# Patient Record
Sex: Female | Born: 1970 | Race: White | Hispanic: Yes | Marital: Married | Smoking: Never smoker
Health system: Southern US, Community
[De-identification: ages and names within clinical notes are randomized; demographics above are authoritative.]

## PROBLEM LIST (undated history)

## (undated) DIAGNOSIS — I10 Essential (primary) hypertension: Secondary | ICD-10-CM

## (undated) HISTORY — PX: OTHER SURGICAL HISTORY: SHX169

## (undated) HISTORY — PX: HIP FRACTURE SURGERY: SHX118

---

## 1999-08-20 ENCOUNTER — Encounter: Admission: RE | Admit: 1999-08-20 | Discharge: 1999-08-20 | Payer: Self-pay | Admitting: Internal Medicine

## 1999-08-21 ENCOUNTER — Encounter: Admission: RE | Admit: 1999-08-21 | Discharge: 1999-11-19 | Payer: Self-pay | Admitting: Infectious Diseases

## 1999-10-22 ENCOUNTER — Encounter: Admission: RE | Admit: 1999-10-22 | Discharge: 1999-10-22 | Payer: Self-pay | Admitting: Internal Medicine

## 1999-10-29 ENCOUNTER — Encounter: Admission: RE | Admit: 1999-10-29 | Discharge: 1999-10-29 | Payer: Self-pay | Admitting: Internal Medicine

## 1999-11-20 ENCOUNTER — Encounter: Admission: RE | Admit: 1999-11-20 | Discharge: 1999-12-30 | Payer: Self-pay | Admitting: Infectious Diseases

## 2000-07-20 ENCOUNTER — Encounter: Admission: RE | Admit: 2000-07-20 | Discharge: 2000-07-20 | Payer: Self-pay | Admitting: Internal Medicine

## 2000-10-15 ENCOUNTER — Encounter: Admission: RE | Admit: 2000-10-15 | Discharge: 2000-10-15 | Payer: Self-pay | Admitting: Internal Medicine

## 2000-11-09 ENCOUNTER — Inpatient Hospital Stay (HOSPITAL_COMMUNITY): Admission: AD | Admit: 2000-11-09 | Discharge: 2000-11-09 | Payer: Self-pay | Admitting: Obstetrics

## 2000-11-10 ENCOUNTER — Encounter: Payer: Self-pay | Admitting: Obstetrics

## 2000-11-11 ENCOUNTER — Inpatient Hospital Stay (HOSPITAL_COMMUNITY): Admission: AD | Admit: 2000-11-11 | Discharge: 2000-11-11 | Payer: Self-pay | Admitting: Obstetrics

## 2000-11-13 ENCOUNTER — Ambulatory Visit (HOSPITAL_COMMUNITY): Admission: RE | Admit: 2000-11-13 | Discharge: 2000-11-13 | Payer: Self-pay | Admitting: Obstetrics

## 2000-11-13 ENCOUNTER — Encounter (INDEPENDENT_AMBULATORY_CARE_PROVIDER_SITE_OTHER): Payer: Self-pay | Admitting: Specialist

## 2000-12-15 ENCOUNTER — Encounter: Admission: RE | Admit: 2000-12-15 | Discharge: 2000-12-15 | Payer: Self-pay | Admitting: Obstetrics & Gynecology

## 2002-09-16 ENCOUNTER — Encounter: Payer: Self-pay | Admitting: *Deleted

## 2002-09-16 ENCOUNTER — Inpatient Hospital Stay (HOSPITAL_COMMUNITY): Admission: AD | Admit: 2002-09-16 | Discharge: 2002-09-16 | Payer: Self-pay | Admitting: Family Medicine

## 2002-09-19 ENCOUNTER — Inpatient Hospital Stay (HOSPITAL_COMMUNITY): Admission: AD | Admit: 2002-09-19 | Discharge: 2002-09-19 | Payer: Self-pay | Admitting: Obstetrics

## 2002-09-23 ENCOUNTER — Inpatient Hospital Stay (HOSPITAL_COMMUNITY): Admission: RE | Admit: 2002-09-23 | Discharge: 2002-09-23 | Payer: Self-pay | Admitting: Obstetrics

## 2002-09-23 ENCOUNTER — Encounter: Payer: Self-pay | Admitting: Obstetrics

## 2002-09-26 ENCOUNTER — Inpatient Hospital Stay (HOSPITAL_COMMUNITY): Admission: AD | Admit: 2002-09-26 | Discharge: 2002-09-26 | Payer: Self-pay | Admitting: Obstetrics

## 2002-09-26 ENCOUNTER — Encounter (INDEPENDENT_AMBULATORY_CARE_PROVIDER_SITE_OTHER): Payer: Self-pay

## 2003-10-18 ENCOUNTER — Inpatient Hospital Stay (HOSPITAL_COMMUNITY): Admission: AD | Admit: 2003-10-18 | Discharge: 2003-10-18 | Payer: Self-pay | Admitting: Obstetrics

## 2003-10-27 ENCOUNTER — Inpatient Hospital Stay (HOSPITAL_COMMUNITY): Admission: RE | Admit: 2003-10-27 | Discharge: 2003-10-27 | Payer: Self-pay | Admitting: Family Medicine

## 2003-11-02 ENCOUNTER — Ambulatory Visit: Payer: Self-pay | Admitting: Family Medicine

## 2003-11-16 ENCOUNTER — Ambulatory Visit: Payer: Self-pay | Admitting: Obstetrics and Gynecology

## 2006-08-17 ENCOUNTER — Emergency Department (HOSPITAL_COMMUNITY): Admission: EM | Admit: 2006-08-17 | Discharge: 2006-08-17 | Payer: Self-pay | Admitting: Emergency Medicine

## 2007-11-10 ENCOUNTER — Inpatient Hospital Stay (HOSPITAL_COMMUNITY): Admission: AD | Admit: 2007-11-10 | Discharge: 2007-11-10 | Payer: Self-pay | Admitting: Obstetrics & Gynecology

## 2007-11-13 ENCOUNTER — Inpatient Hospital Stay (HOSPITAL_COMMUNITY): Admission: AD | Admit: 2007-11-13 | Discharge: 2007-11-13 | Payer: Self-pay | Admitting: Obstetrics & Gynecology

## 2007-11-24 ENCOUNTER — Ambulatory Visit: Payer: Self-pay | Admitting: Obstetrics and Gynecology

## 2007-12-15 ENCOUNTER — Encounter: Payer: Self-pay | Admitting: Obstetrics and Gynecology

## 2007-12-15 ENCOUNTER — Ambulatory Visit: Payer: Self-pay | Admitting: Obstetrics & Gynecology

## 2010-02-09 ENCOUNTER — Encounter: Payer: Self-pay | Admitting: *Deleted

## 2010-02-09 ENCOUNTER — Encounter: Payer: Self-pay | Admitting: Obstetrics and Gynecology

## 2010-02-10 ENCOUNTER — Encounter: Payer: Self-pay | Admitting: Obstetrics & Gynecology

## 2010-06-04 NOTE — Group Therapy Note (Signed)
NAME:  Rachel Le NO.:  [id number]   MEDICAL RECORD NO.:  [id number]          PATIENT TYPE:  WOC   LOCATION:  [facility]                   FACILITY:  [facility]   PHYSICIAN:  Argentina Donovan, MD        DATE OF BIRTH:  03-Jun-1970   DATE OF SERVICE:                                  CLINIC NOTE   The patient is a 40 year old Hispanic female gravida 5, para 0-0-5-0 who  was seen in the MAU on October 24 and found to be about [redacted] weeks  pregnant on ultrasound that showed a 6-week fetal demise.  She was given  Cytotec and within a couple days passed everything.  She has been fine  since that point.  We are drawing a beta HCG to make sure everything has  been passed today.  The patient said she was told that she should not  get pregnant because of her uterus.  She had an ultrasound that was done  back in 2005 that was consistent with a bicornate uterus of very small  size.  She is interested to some degree in having a tubal ligation.  I  talked to her husband and he said he is willing to go for a vasectomy.  I told her they could make their decision and we would try and comply  with her wishes.   IMPRESSION:  Complete abortion.  The patient should be notified of the  results of the quantitative data.           ______________________________  Argentina Donovan, MD     PR/MEDQ  D:  11/24/2007  T:  11/24/2007  Job:  [id number]

## 2010-06-07 NOTE — Group Therapy Note (Signed)
NAME:  Rachel Le NO.:  [id number]   MEDICAL RECORD NO.:  [id number]          PATIENT TYPE:  WOC   LOCATION:  [facility]                   FACILITY:  [facility]   PHYSICIAN:  Tinnie Gens, MD        DATE OF BIRTH:  [birth year]   DATE OF SERVICE:  11/02/2003                                    CLINIC NOTE   CHIEF COMPLAINT:  Follow-up.   HISTORY OF PRESENT ILLNESS:  The patient is a 40 year old gravida 4 para 0-0-  4-0 with history of four SABs, the latest of which was approximately 1 week  ago for which she received Cytotec.  She does have some bleeding and  continues to bleed today.  The patient was noted at the time of her  miscarriage to have a bicornuate versus septate uterus on ultrasound which  has never been completely worked up.  She is here for follow-up on that and  possible workup.   PAST MEDICAL HISTORY:  Negative.  The patient does have a history of a fall  with injured legs at age 89.  She uses crutches full time.   PAST SURGICAL HISTORY:  She did have surgery on her legs.   ALLERGIES:  None known.   MEDICATIONS:  None.   OBSTETRICAL HISTORY:  Four SABs.   GYNECOLOGICAL HISTORY:  Negative.   FAMILY HISTORY:  Significant for diabetes, coronary artery disease, and  hypertension.   SOCIAL HISTORY:  She denies tobacco, alcohol, or drug use.   REVIEW OF SYSTEMS:  A 14-point review of systems reviewed and is negative  except for as stated in the HPI.   PHYSICAL EXAMINATION:  VITAL SIGNS:  Her pulse is 90, blood pressure 115/68,  weight is 115.  GENERAL:  She is a very small Hispanic female in no acute distress.  ABDOMEN:  Soft, nontender, nondistended.  GENITOURINARY:  She has normal external female genitalia.  The vagina has a  lot of blood pooled in the vault.  The uterus is approximately 6 weeks size  and nontender.   IMPRESSION:  1.  Status post Cytotec, continued bleeding.  2.  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   PLAN:  1.  The patient will follow up in 2 weeks for the post AB visit.  She will      have Pap smear and discussion of birth control at that time.  2.  When the patient and her husband are ready and available, will send them      for MRI for confirmation of diagnosis, followed by appropriate surgical      management and referral as needed.      TP/MEDQ  D:  11/02/2003  T:  11/02/2003  Job:  [id number]

## 2010-06-07 NOTE — Op Note (Signed)
[facility]  Patient:    Rachel Le Visit Number: [id number] MRN: [id number]          Service Type: DSU Location: [facility] Attending Physician:  Tammi Sou Dictated by:   Bing Neighbors Clearance Coots, M.D. Proc. Date: 11/13/00 Admit Date:  11/13/2000 Discharge Date: 11/13/2000                             Operative Report  PREOPERATIVE DIAGNOSES:       A 7 week intrauterine fetal demise by ultrasound.  POSTOPERATIVE DIAGNOSES:      A 7 week intrauterine fetal demise by ultrasound.  PROCEDURE:                    Suction dilatation and evacuation.  SURGEON:                      Charles A. Clearance Coots, M.D.  ANESTHESIA:                   MAC with paracervical block.  ESTIMATED BLOOD LOSS:         100 ml.  COMPLICATIONS:                None.  SPECIMEN:                     Products of conception.  OPERATION:                    Patient was brought to the operating room after satisfactory IV sedation.  Legs were brought up in stirrups and the vagina was prepped and draped in the usual sterile fashion.  The urinary bladder was emptied of approximately 50 cc of clear urine.  Bimanual examination revealed the uterus to be mid position, about 8 weeks size.  Sterile speculum was inserted in the vaginal vault and cervix was isolated.  Anterior lip of the cervix was grasped with a single tooth tenaculum.  Approximately 20 ml of 2% plain Xylocaine with 2 ml of sodium bicarbonate was injected in the cervical stroma in a routine fashion, approximately 8 ml in each lateral fornix at the 3 and 6 oclock position and approximately 4 ml in the anterior lip of the cervix prior to application of the single tooth tenaculum.  The uterus was sounded and the cervix was dilated to approximately 25 ml with the [facility] dilator.  A #8 suction catheter was easily introduced into the uterine cavity. All contents were evacuated.  Endometrial surface was then curetted with  a small sharp curette.  No further products of conception were obtained.  There was no active bleeding at the conclusion of the procedure.  The uterus contracted down quite well.  All instruments were retired.  Patient tolerated procedure well.  Was transported to the recovery room in satisfactory condition. Dictated by:   Bing Neighbors Clearance Coots, M.D. Attending Physician:  Tammi Sou DD:  11/13/00 TD:  11/15/00 Job: [id number]

## 2010-06-07 NOTE — Group Therapy Note (Signed)
NAME:  Rachel Le NO.:  [id number]   MEDICAL RECORD NO.:  [id number]          PATIENT TYPE:  WOC   [facility]ATION[facility]                   [facility]ACILITY[facility]  [facility]   PHYSICIAN:  Argentina Donovan, MD        DATE OF BIRTH:  05/20/1970   DATE OF SERVICE:  11/16/2003                                    CLINIC NOTE   REASON FOR VISIT:  The patient is a 40 year old gravida 4 para 0-0-4-0  Hispanic female with a history of four spontaneous abortions, all within the  early first trimester.  She came in today after having finally stopped  bleeding and a Pap smear was taken.  She has never been worked up, but on  ultrasound they thought there either was a uterine septum or possibly a  bicornuate uterus.  She is going to be followed up with an MRI, but since  they were all first trimester miscarriages we are going to do an  anticardiolipin antibody, antinuclear antibody, and thyroid evaluation as a  step-wise process evaluating why this young lady continues to miscarry.   DIAGNOSIS:  Habitual abortion.      PR/MEDQ  D:  11/16/2003  T:  11/16/2003  Job:  [id number]

## 2010-10-21 LAB — CBC
HCT: 40.3
HCT: 41.2
Hemoglobin: 13.4
MCV: 92.9
MCV: 94.1
Platelets: 233
RDW: 12.9
RDW: 12.9

## 2010-10-21 LAB — TYPE AND SCREEN: Antibody Screen: NEGATIVE

## 2010-10-21 LAB — ABO/RH: ABO/RH(D): O POS

## 2010-10-21 LAB — WET PREP, GENITAL: Yeast Wet Prep HPF POC: NONE SEEN

## 2010-10-21 LAB — HCG, QUANTITATIVE, PREGNANCY: hCG, Beta Chain, Quant, S: 1964 — ABNORMAL HIGH

## 2010-10-21 LAB — GC/CHLAMYDIA PROBE AMP, GENITAL: Chlamydia, DNA Probe: NEGATIVE

## 2015-12-17 ENCOUNTER — Other Ambulatory Visit: Payer: Self-pay | Admitting: Obstetrics and Gynecology

## 2015-12-17 DIAGNOSIS — Z1231 Encounter for screening mammogram for malignant neoplasm of breast: Secondary | ICD-10-CM

## 2016-01-17 ENCOUNTER — Ambulatory Visit (HOSPITAL_COMMUNITY)
Admission: RE | Admit: 2016-01-17 | Discharge: 2016-01-17 | Disposition: A | Discharge status: Home / Self Care | Payer: Self-pay | Source: Ambulatory Visit | Attending: Obstetrics and Gynecology | Admitting: Obstetrics and Gynecology

## 2016-01-17 ENCOUNTER — Encounter (HOSPITAL_COMMUNITY): Payer: Self-pay | Admitting: *Deleted

## 2016-01-17 ENCOUNTER — Ambulatory Visit
Admission: RE | Admit: 2016-01-17 | Discharge: 2016-01-17 | Disposition: A | Discharge status: Home / Self Care | Payer: No Typology Code available for payment source | Source: Ambulatory Visit | Attending: Obstetrics and Gynecology | Admitting: Obstetrics and Gynecology

## 2016-01-17 VITALS — BP 152/94 | Temp 97.4°F | Wt 149.6 lb

## 2016-01-17 DIAGNOSIS — Z01419 Encounter for gynecological examination (general) (routine) without abnormal findings: Secondary | ICD-10-CM

## 2016-01-17 DIAGNOSIS — Z1231 Encounter for screening mammogram for malignant neoplasm of breast: Secondary | ICD-10-CM

## 2016-01-17 HISTORY — DX: Essential (primary) hypertension: I10

## 2016-01-17 NOTE — Progress Notes (Signed)
Pap smear completed

## 2016-01-17 NOTE — Patient Instructions (Signed)
Explained breast self awareness to Health Netvonne Cervantes. Patient did not need a Pap smear today due to last Pap smear was in February 2014 per patient. Let her know [facility] will cover Pap smears and HPV typing every 5 years unless has a history of abnormal Pap smears. [facility]. Appointment scheduled for Thursday, January 17, 2016 at 1400. Let patient know will follow up with her within the next couple weeks with results of Pap smear by phone. Informed patient that the Breast Center will follow up with her within the next couple of weeks with results of mammogram by letter or phone. Wendelyn BreslowIvonne Cervantes verbalized understanding.  Keiko Myricks, Kathaleen Maserhristine Poll, RN 2:36 PM

## 2016-01-17 NOTE — Progress Notes (Signed)
No complaints today.   Pap Smear: Pap smear completed today. Last Pap smear was in February 2014 at the free cervical cancer screening [facility] and normal per patient. Per patient has no history of an abnormal Pap smear. No Pap smear results are in EPIC.  Physical exam: Breasts Breasts symmetrical. No skin abnormalities bilateral breasts. No nipple retraction bilateral breasts. No nipple discharge bilateral breasts. No lymphadenopathy. No lumps palpated bilateral breasts. No complaints of pain or tenderness on exam. [facility] mamm[facility]. Appointment scheduled for Thursday, January 17, 2016 at 1400.  Pelvic/Bimanual   Ext Genitalia No lesions, no swelling and no discharge observed on external genitalia.         Vagina Vagina pink and normal texture. No lesions or discharge observed in vagina.          Cervix Cervix is present. Cervix pink and of normal texture. No discharge observed.     Uterus Uterus is present and palpable. Uterus in normal position and normal size.        Adnexae Bilateral ovaries present and palpable. No tenderness on palpation.          Rectovaginal No rectal exam completed today since patient had no rectal complaints. No skin abnormalities observed on exam.    Smoking History: Patient has never smoked.  Patient Navigation: Patient education provided. Access to services provided [facility]. Spanish interpreter provided.  Used Spanish interpreter [employer] from [place].

## 2016-01-18 LAB — CYTOLOGY - PAP
Diagnosis: NEGATIVE
HPV: NOT DETECTED

## 2016-01-25 ENCOUNTER — Encounter (HOSPITAL_COMMUNITY): Payer: Self-pay | Admitting: *Deleted

## 2016-02-11 ENCOUNTER — Telehealth (HOSPITAL_COMMUNITY): Payer: Self-pay | Admitting: *Deleted

## 2016-02-11 NOTE — Telephone Encounter (Signed)
Telephoned patient at home number and left message to return call to [facility] 

## 2016-02-13 ENCOUNTER — Telehealth (HOSPITAL_COMMUNITY): Payer: Self-pay | Admitting: *Deleted

## 2016-02-13 NOTE — Telephone Encounter (Signed)
Telephoned patient at home number and discussed negative pap smear results. HPV was negative. Next pap smear due in five years. Used interpreter Julie Sowell.  

## 2017-03-10 ENCOUNTER — Other Ambulatory Visit: Payer: Self-pay | Admitting: Obstetrics and Gynecology

## 2017-03-10 DIAGNOSIS — Z1231 Encounter for screening mammogram for malignant neoplasm of breast: Secondary | ICD-10-CM

## 2017-04-02 ENCOUNTER — Ambulatory Visit
Admission: RE | Admit: 2017-04-02 | Discharge: 2017-04-02 | Disposition: A | Discharge status: Home / Self Care | Payer: No Typology Code available for payment source | Source: Ambulatory Visit | Attending: Obstetrics and Gynecology | Admitting: Obstetrics and Gynecology

## 2017-04-02 ENCOUNTER — Ambulatory Visit (HOSPITAL_COMMUNITY)
Admission: RE | Admit: 2017-04-02 | Discharge: 2017-04-02 | Disposition: A | Discharge status: Home / Self Care | Payer: Self-pay | Source: Ambulatory Visit | Attending: Obstetrics and Gynecology | Admitting: Obstetrics and Gynecology

## 2017-04-02 ENCOUNTER — Encounter (HOSPITAL_COMMUNITY): Payer: Self-pay

## 2017-04-02 VITALS — BP 141/74

## 2017-04-02 DIAGNOSIS — Z1239 Encounter for other screening for malignant neoplasm of breast: Secondary | ICD-10-CM

## 2017-04-02 DIAGNOSIS — Z1231 Encounter for screening mammogram for malignant neoplasm of breast: Secondary | ICD-10-CM

## 2017-04-02 NOTE — Progress Notes (Signed)
No complaints today.   Pap Smear: Pap smear not completed today. [facility] and normal with negative HPV. Per patient has no history of an abnormal Pap smear. Last Pap smear result is in Epic.  Physical exam: Breasts Breasts symmetrical. No skin abnormalities bilateral breasts. No nipple retraction bilateral breasts. No nipple discharge bilateral breasts. No lymphadenopathy. No lumps palpated bilateral breasts. No complaints of pain or tenderness on exam. [facility] for [facility] mamm[facility]. Appointment scheduled for Thursday, April 02, 2017 at 1010.        Pelvic/Bimanual No Pap smear completed today since last Pap smear and HPV typing was 01/17/2016. Pap smear not indicated per [facility] guidelines.   Smoking History: Patient has never smoked.  Patient Navigation: Patient education provided. Access to services provided for patient through [facility] program.   Colorectal Cancer Screening: Per patient has never had a colonoscopy completed. No complaints today. FIT Test given to patient to complete and return to [facility].  Breast and Cervical Cancer Risk Assessment: Patient has no family history of breast cancer, known genetic mutations, or radiation treatment to the chest before age 45. Patient has no history of cervical dysplasia, immunocompromised, or DES exposure in-utero.

## 2017-04-02 NOTE — Patient Instructions (Signed)
Explained breast self awareness with MA. Hedy CamaraIvonne Cervantes Dominguez. Patient did not need a Pap smear today due to last Pap smear and HPV typing was 01/17/2016. Let her know [facility] will cover Pap smears and HPV typing every 5 years unless has a history of abnormal Pap smears. [facility] screening mamm[facility]. Appointment scheduled for Thursday, April 02, 2017 at 1010. Let patient know the Breast Center will follow up with her within the next couple weeks with results of mammogram by letter or phone. MA. Hedy CamaraIvonne Cervantes Dominguez verbalized understanding.  Jermine Bibbee, Kathaleen Maserhristine Poll, RN 12:38 PM

## 2017-04-03 ENCOUNTER — Encounter (HOSPITAL_COMMUNITY): Payer: Self-pay | Admitting: *Deleted

## 2017-05-17 ENCOUNTER — Other Ambulatory Visit: Payer: Self-pay

## 2017-05-17 ENCOUNTER — Encounter (HOSPITAL_COMMUNITY): Payer: Self-pay | Admitting: *Deleted

## 2017-05-17 ENCOUNTER — Ambulatory Visit (HOSPITAL_COMMUNITY)
Admission: EM | Admit: 2017-05-17 | Discharge: 2017-05-17 | Disposition: A | Discharge status: Home / Self Care | Payer: No Typology Code available for payment source | Attending: Urgent Care | Admitting: Urgent Care

## 2017-05-17 DIAGNOSIS — I10 Essential (primary) hypertension: Secondary | ICD-10-CM

## 2017-05-17 DIAGNOSIS — Z8711 Personal history of peptic ulcer disease: Secondary | ICD-10-CM

## 2017-05-17 DIAGNOSIS — Z8719 Personal history of other diseases of the digestive system: Secondary | ICD-10-CM

## 2017-05-17 DIAGNOSIS — R112 Nausea with vomiting, unspecified: Secondary | ICD-10-CM

## 2017-05-17 DIAGNOSIS — R1013 Epigastric pain: Secondary | ICD-10-CM

## 2017-05-17 MED ORDER — RANITIDINE HCL 150 MG PO TABS: 150.0000 mg | ORAL_TABLET | Freq: Two times a day (BID) | ORAL | 0 refills | Status: DC

## 2017-05-17 MED ORDER — OMEPRAZOLE 20 MG PO CPDR: 20.0000 mg | DELAYED_RELEASE_CAPSULE | Freq: Every day | ORAL | 1 refills | Status: AC

## 2017-05-17 NOTE — ED Provider Notes (Signed)
  MRN: [id number] DOB: Jan 06, 1971  Subjective:   MA. Yong Wahlquist is a 47 y.o. female presenting for 1 day history of recurrent epigastric pain that radiates into her back.  She has had nausea and vomiting as well.  Denies fever, hematemesis, diarrhea, constipation, history of abdominal surgeries or pancreatitis, dysuria, urinary frequency, hematuria, rashes, dizziness, confusion.  Denies smoking cigarettes. Drinks 1 alcohol drink per month.  She does have a history of hypertension and is compliant with her blood pressure medications.  Admits to remote history of peptic ulcer disease, resolved with taking a course of Prilosec.  She did try Prilosec yesterday with minimal relief but has not been on this consistently.   No current facility-administered medications for this encounter.   Current Outpatient Medications:  .  lisinopril-hydrochlorothiazide (PRINZIDE,ZESTORETIC) 10-12.5 MG tablet, Take 1 tablet by mouth daily., Disp: , Rfl:     No Known Allergies   Past Medical History:  Diagnosis Date  . Hypertension      Past Surgical History:  Procedure Laterality Date  . bilateral knee surgery    . HIP FRACTURE SURGERY      Reports family history of HTN. Denies family history of cancer, diabetes, HL, heart disease, stroke, mental illness.    Objective:   Vitals: BP 126/64 (BP Location: Left Arm)   Pulse 78   Temp 98.2 F (36.8 C) (Oral)   LMP 04/28/2017 (Exact Date)   SpO2 100%   Physical Exam  Constitutional: She is oriented to person, place, and time. She appears well-developed and well-nourished.  HENT:  Mouth/Throat: Oropharynx is clear and moist.  Eyes: No scleral icterus.  Cardiovascular: Normal rate, regular rhythm and intact distal pulses. Exam reveals no gallop and no friction rub.  No murmur heard. Pulmonary/Chest: No respiratory distress. She has no wheezes. She has no rales.  Abdominal: Soft. Bowel sounds are normal. She exhibits no distension and  no mass. There is tenderness (exquisite tenderness throughout abdominal exam even with light pressure of stethoscope). There is no rebound and no guarding.  Musculoskeletal: She exhibits no edema.  Neurological: She is alert and oriented to person, place, and time.  Skin: Skin is warm and dry. No rash noted. No erythema. No pallor.  Psychiatric: She has a normal mood and affect.   Assessment and Plan :   Abdominal pain, epigastric  Non-intractable vomiting with nausea, unspecified vomiting type  History of gastric ulcer  Essential hypertension  Counseled patient with the exquisite tenderness she has on exam including light pressure of the stethoscope she may be better off going to the ER for further imaging.  Patient is not agreeable to this but would like to try to get a prescription for Prilosec again.  I counseled that she could try this as well as Zantac but should have a low threshold to go to the ER.  Patient is in agreement with treatment plan.   Wallis Bamberg, PA-C 05/17/17 1257

## 2017-05-17 NOTE — ED Triage Notes (Addendum)
Per pt her pain started yesterday. Per pt she is having upper abd pain. Per pt it radiates to the center of her back.

## 2018-02-03 ENCOUNTER — Other Ambulatory Visit (HOSPITAL_COMMUNITY): Payer: Self-pay | Admitting: *Deleted

## 2018-02-03 DIAGNOSIS — Z1231 Encounter for screening mammogram for malignant neoplasm of breast: Secondary | ICD-10-CM

## 2018-04-08 ENCOUNTER — Ambulatory Visit (HOSPITAL_COMMUNITY)
Admission: RE | Admit: 2018-04-08 | Discharge: 2018-04-08 | Disposition: A | Discharge status: Home / Self Care | Payer: Self-pay | Source: Ambulatory Visit | Attending: Obstetrics and Gynecology | Admitting: Obstetrics and Gynecology

## 2018-04-08 ENCOUNTER — Encounter (HOSPITAL_COMMUNITY): Payer: Self-pay

## 2018-04-08 ENCOUNTER — Other Ambulatory Visit: Payer: Self-pay

## 2018-04-08 ENCOUNTER — Ambulatory Visit
Admission: RE | Admit: 2018-04-08 | Discharge: 2018-04-08 | Disposition: A | Discharge status: Home / Self Care | Payer: No Typology Code available for payment source | Source: Ambulatory Visit | Attending: Obstetrics and Gynecology | Admitting: Obstetrics and Gynecology

## 2018-04-08 VITALS — BP 134/86 | Temp 98.4°F | Wt 122.0 lb

## 2018-04-08 DIAGNOSIS — Z1239 Encounter for other screening for malignant neoplasm of breast: Secondary | ICD-10-CM

## 2018-04-08 DIAGNOSIS — Z1231 Encounter for screening mammogram for malignant neoplasm of breast: Secondary | ICD-10-CM

## 2018-04-08 NOTE — Patient Instructions (Signed)
Explained breast self awareness with MA. Hedy Camara. Patient did not need a Pap smear today due to last Pap smear and HPV typing was 01/17/2016. Let her know [facility] will cover Pap smears and HPV typing every 5 years unless has a history of abnormal Pap smears. Referred patient to the [facility] for a screening mammogram. Appointment scheduled for Thursday, April 08, 2018 at 1110.  Patient aware of appointment and will be there. Let patient know the Breast Center will follow up with her within the next couple weeks with results of mammogram by letter or phone. MA. Hedy Camara verbalized understanding.  [place], Kathaleen Maser, RN 12:33 PM

## 2018-04-08 NOTE — Progress Notes (Signed)
No complaints today.   Pap Smear: Pap smear not completed today. Last Pap smear was 01/17/2016 at [facility] and normal with negative HPV. Per patient has no history of an abnormal Pap smear. Last Pap smear result is in Epic.  Physical exam: Breasts Breasts symmetrical. No skin abnormalities bilateral breasts. No nipple retraction bilateral breasts. No nipple discharge bilateral breasts. No lymphadenopathy. No lumps palpated bilateral breasts. No complaints of pain or tenderness on exam. Referred patient to the [facility] for a screening mammogram. Appointment scheduled for Thursday, April 08, 2018 at 1110.        Pelvic/Bimanual No Pap smear completed today since last Pap smear and HPV typing was 01/17/2016. Pap smear not indicated per [facility] guidelines.   Smoking History: Patient has never smoked.  Patient Navigation: Patient education provided. Access to services provided for patient through [facility] program. Spanish interpreter provided.   Breast and Cervical Cancer Risk Assessment: Patient has no family history of breast cancer, known genetic mutations, or radiation treatment to the chest before age 79. Patient has no history of cervical dysplasia, immunocompromised, or DES exposure in-utero.  Risk Assessment    Risk Scores      04/08/2018   Last edited by: Lynnell Dike, LPN   5-year risk: 0.6 %   Lifetime risk: 6.9 %         Used Spanish interpreter Natale Lay from [place].

## 2018-04-09 ENCOUNTER — Other Ambulatory Visit: Payer: Self-pay | Admitting: Obstetrics and Gynecology

## 2018-04-09 DIAGNOSIS — R928 Other abnormal and inconclusive findings on diagnostic imaging of breast: Secondary | ICD-10-CM

## 2018-04-21 ENCOUNTER — Ambulatory Visit
Admission: RE | Admit: 2018-04-21 | Discharge: 2018-04-21 | Disposition: A | Discharge status: Home / Self Care | Payer: No Typology Code available for payment source | Source: Ambulatory Visit | Attending: Obstetrics and Gynecology | Admitting: Obstetrics and Gynecology

## 2018-04-21 ENCOUNTER — Other Ambulatory Visit: Payer: Self-pay

## 2018-04-21 DIAGNOSIS — R928 Other abnormal and inconclusive findings on diagnostic imaging of breast: Secondary | ICD-10-CM

## 2018-05-06 ENCOUNTER — Encounter (HOSPITAL_COMMUNITY): Payer: Self-pay | Admitting: *Deleted

## 2019-04-13 ENCOUNTER — Other Ambulatory Visit: Payer: Self-pay

## 2019-04-13 DIAGNOSIS — Z1231 Encounter for screening mammogram for malignant neoplasm of breast: Secondary | ICD-10-CM

## 2019-05-05 ENCOUNTER — Ambulatory Visit
Admission: RE | Admit: 2019-05-05 | Discharge: 2019-05-05 | Disposition: A | Discharge status: Home / Self Care | Payer: No Typology Code available for payment source | Source: Ambulatory Visit | Attending: Obstetrics and Gynecology | Admitting: Obstetrics and Gynecology

## 2019-05-05 ENCOUNTER — Ambulatory Visit: Payer: Self-pay

## 2019-05-05 ENCOUNTER — Other Ambulatory Visit: Payer: Self-pay

## 2019-05-05 VITALS — BP 138/86 | Temp 98.6°F | Wt 127.0 lb

## 2019-05-05 DIAGNOSIS — Z1239 Encounter for other screening for malignant neoplasm of breast: Secondary | ICD-10-CM

## 2019-05-05 DIAGNOSIS — Z1231 Encounter for screening mammogram for malignant neoplasm of breast: Secondary | ICD-10-CM

## 2019-05-05 NOTE — Progress Notes (Signed)
[name]. Rachel Le is a 49 y.o. female who presents to [facility] clinic today for clinical breast exam.  Breast History:  Patient with no current breast complaints.  She states she does not perform breast exams, but does have breast awareness.     Gynecological History: Pap smear not completed today. Last Pap smear was Dec 2017 at  [facility] and was normal. Per patient has no history of an abnormal Pap smear. Last Pap smear result is available in Epic.   Physical exam: Breasts Breasts symmetrical. No skin abnormalities bilateral breasts. No nipple retraction bilateral breasts. No nipple discharge bilateral breasts. No lymphadenopathy. Diffuse lumps palpated bilaterally particular to lower inner and outer segments from ~ 5'o clock to 8'o clock.       Pelvic/Bimanual Pap is not indicated today    Smoking History: Patient has never smoked.    Patient Navigation: Patient education provided. Access to services provided for patient through [facility] program.   Colorectal Cancer Screening: Per patient has never had colonoscopy completed No complaints today.    Breast and Cervical Cancer Risk Assessment: Patient does not have family history of breast cancer, known genetic mutations, or radiation treatment to the chest before age 49. Patient does not have history of cervical dysplasia, immunocompromised, or DES exposure in-utero.  Risk Assessment    Risk Scores      05/05/2019 04/08/2018   Last edited by: Narda Rutherford, LPN Stoney Bang H, LPN   5-year risk: 0.6 % 0.6 %   Lifetime risk: 6.8 % 6.9 %          A: 49 year old Clinical Breast Exam  P: Referred patient to the [facility] of [facility] for a screening mammogram. Appointment scheduled April 15 at 1430pm.  Rachel Le, CNM 05/05/2019 1:47 PM

## 2019-08-25 ENCOUNTER — Encounter (HOSPITAL_COMMUNITY): Payer: Self-pay | Admitting: *Deleted

## 2019-08-25 ENCOUNTER — Ambulatory Visit (HOSPITAL_COMMUNITY)
Admission: EM | Admit: 2019-08-25 | Discharge: 2019-08-25 | Disposition: A | Discharge status: Home / Self Care | Payer: No Typology Code available for payment source

## 2019-08-25 ENCOUNTER — Observation Stay (HOSPITAL_COMMUNITY)
Admission: EM | Admit: 2019-08-25 | Discharge: 2019-08-27 | Disposition: A | Discharge status: Home / Self Care | Payer: No Typology Code available for payment source | Attending: Emergency Medicine | Admitting: Emergency Medicine

## 2019-08-25 ENCOUNTER — Other Ambulatory Visit: Payer: Self-pay

## 2019-08-25 ENCOUNTER — Emergency Department (HOSPITAL_COMMUNITY): Payer: No Typology Code available for payment source

## 2019-08-25 DIAGNOSIS — I10 Essential (primary) hypertension: Secondary | ICD-10-CM | POA: Diagnosis present

## 2019-08-25 DIAGNOSIS — Z20822 Contact with and (suspected) exposure to covid-19: Secondary | ICD-10-CM | POA: Insufficient documentation

## 2019-08-25 DIAGNOSIS — R1011 Right upper quadrant pain: Secondary | ICD-10-CM | POA: Diagnosis present

## 2019-08-25 DIAGNOSIS — Z79899 Other long term (current) drug therapy: Secondary | ICD-10-CM | POA: Insufficient documentation

## 2019-08-25 DIAGNOSIS — R7401 Elevation of levels of liver transaminase levels: Secondary | ICD-10-CM | POA: Diagnosis present

## 2019-08-25 DIAGNOSIS — B169 Acute hepatitis B without delta-agent and without hepatic coma: Principal | ICD-10-CM

## 2019-08-25 LAB — I-STAT BETA HCG BLOOD, ED (MC, WL, AP ONLY): I-stat hCG, quantitative: <5 m[IU]/mL (ref <5)

## 2019-08-25 LAB — MAGNESIUM: Magnesium: 2.1 mg/dL (ref 1.7–2.4)

## 2019-08-25 LAB — URINALYSIS, ROUTINE W REFLEX MICROSCOPIC
Bilirubin Urine: NEGATIVE
Glucose, UA: NEGATIVE mg/dL
Hgb urine dipstick: NEGATIVE
Ketones, ur: NEGATIVE mg/dL
Leukocytes,Ua: NEGATIVE
Nitrite: NEGATIVE
Protein, ur: NEGATIVE mg/dL
Specific Gravity, Urine: 1.01 (ref 1.005–1.030)
pH: 7 (ref 5.0–8.0)

## 2019-08-25 LAB — CBC
HCT: 42 % (ref 36.0–46.0)
Hemoglobin: 13.8 g/dL (ref 12.0–15.0)
MCH: 30.4 pg (ref 26.0–34.0)
MCHC: 32.9 g/dL (ref 30.0–36.0)
MCV: 92.5 fL (ref 80.0–100.0)
Platelets: 233 10*3/uL (ref 150–400)
RBC: 4.54 MIL/uL (ref 3.87–5.11)
RDW: 13.9 % (ref 11.5–15.5)
WBC: 5.7 10*3/uL (ref 4.0–10.5)
nRBC: 0 % (ref 0.0–0.2)

## 2019-08-25 LAB — COMPREHENSIVE METABOLIC PANEL
ALT: 2720 U/L — ABNORMAL HIGH (ref 0–44)
AST: 1553 U/L — ABNORMAL HIGH (ref 15–41)
Albumin: 3.5 g/dL (ref 3.5–5.0)
Alkaline Phosphatase: 201 U/L — ABNORMAL HIGH (ref 38–126)
Anion gap: 6 (ref 5–15)
BUN: 9 mg/dL (ref 6–20)
CO2: 29 mmol/L (ref 22–32)
Calcium: 8.7 mg/dL — ABNORMAL LOW (ref 8.9–10.3)
Chloride: 100 mmol/L (ref 98–111)
Creatinine, Ser: 0.44 mg/dL (ref 0.44–1.00)
GFR calc Af Amer: >60 mL/min (ref >60)
GFR calc non Af Amer: >60 mL/min (ref >60)
Glucose, Bld: 110 mg/dL — ABNORMAL HIGH (ref 70–99)
Potassium: 4.2 mmol/L (ref 3.5–5.1)
Sodium: 135 mmol/L (ref 135–145)
Total Bilirubin: 5.8 mg/dL — ABNORMAL HIGH (ref 0.3–1.2)
Total Protein: 6.8 g/dL (ref 6.5–8.1)

## 2019-08-25 LAB — LIPASE, BLOOD: Lipase: 36 U/L (ref 11–51)

## 2019-08-25 LAB — PHOSPHORUS: Phosphorus: 2.9 mg/dL (ref 2.5–4.6)

## 2019-08-25 LAB — PROTIME-INR
INR: 1.1 (ref 0.8–1.2)
Prothrombin Time: 14 seconds (ref 11.4–15.2)

## 2019-08-25 LAB — ACETAMINOPHEN LEVEL: Acetaminophen (Tylenol), Serum: <10 ug/mL — ABNORMAL LOW (ref 10–30)

## 2019-08-25 LAB — CK: Total CK: 63 U/L (ref 38–234)

## 2019-08-25 LAB — SARS CORONAVIRUS 2 BY RT PCR (HOSPITAL ORDER, PERFORMED IN ~~LOC~~ HOSPITAL LAB): SARS Coronavirus 2: NEGATIVE

## 2019-08-25 LAB — LACTATE DEHYDROGENASE: LDH: 741 U/L — ABNORMAL HIGH (ref 98–192)

## 2019-08-25 MED ORDER — SODIUM CHLORIDE 0.9 % IV SOLN: 2.0000 g | INTRAVENOUS | Status: DC

## 2019-08-25 MED ORDER — ONDANSETRON HCL 4 MG/2ML IJ SOLN
4.0000 mg | Freq: Once | INTRAMUSCULAR | Status: AC
  Administered 2019-08-25: 4 mg via INTRAVENOUS
  Filled 2019-08-25: qty 2

## 2019-08-25 MED ORDER — SODIUM CHLORIDE 0.9 % IV BOLUS
1000.0000 mL | Freq: Once | INTRAVENOUS | Status: AC
  Administered 2019-08-25: 1000 mL via INTRAVENOUS

## 2019-08-25 MED ORDER — PIPERACILLIN-TAZOBACTAM 3.375 G IVPB
3.3750 g | Freq: Three times a day (TID) | INTRAVENOUS | Status: DC
  Administered 2019-08-25 – 2019-08-26 (×2): 3.375 g via INTRAVENOUS
  Filled 2019-08-25 (×2): qty 50

## 2019-08-25 MED ORDER — IOHEXOL 300 MG/ML  SOLN
100.0000 mL | Freq: Once | INTRAMUSCULAR | Status: AC | PRN
  Administered 2019-08-25: 100 mL via INTRAVENOUS

## 2019-08-25 MED ORDER — SODIUM CHLORIDE 0.9% FLUSH
3.0000 mL | Freq: Once | INTRAVENOUS | Status: AC
  Administered 2019-08-26: 3 mL via INTRAVENOUS

## 2019-08-25 NOTE — ED Provider Notes (Signed)
[facility] EMERGENCY DEPARTMENT Provider Note   CSN: [id number] Arrival date & time: 08/25/19  1004     History Chief Complaint  Patient presents with  . Abdominal Pain  . abnormal labs   Patient offered translator however she declined  [name] is a 49 y.o. female.  HPI   49 year old female with a history of hypertension who presented to the emergency department today for evaluation of abdominal pain and abnormal labs.  Patient states that for the last week she has had epigastric abdominal pain that radiates to her back.  She states every time she eats she feels very full and has nausea and vomiting.  He was seen by her PCP advised her to her abnormal liver enzymes.  She denies any diarrhea, fevers.  She denies any history of abdominal surgeries  Past Medical History:  Diagnosis Date  . Hypertension     There are no problems to display for this patient.   Past Surgical History:  Procedure Laterality Date  . bilateral knee surgery    . HIP FRACTURE SURGERY       OB History    Gravida  5   Para      Term      Preterm      AB  5   Living        SAB  5   TAB      Ectopic      Multiple      Live Births              Family History  Problem Relation Age of Onset  . Hypertension Mother   . Breast cancer Neg Hx     Social History   Tobacco Use  . Smoking status: Never Smoker  . Smokeless tobacco: Never Used  Vaping Use  . Vaping Use: Never used  Substance Use Topics  . Alcohol use: Yes    Comment: occassionally  . Drug use: No    Home Medications Prior to Admission medications   Medication Sig Start Date End Date Taking? Authorizing Provider  lisinopril-hydrochlorothiazide (PRINZIDE,ZESTORETIC) 10-12.5 MG tablet Take 1 tablet by mouth daily.    [provider]  omeprazole (PRILOSEC) 20 MG capsule Take 1 capsule (20 mg total) by mouth daily. Patient not taking: Reported on 04/08/2018  05/17/17   Wallis Bamberg, PA-C  ranitidine (ZANTAC) 150 MG tablet Take 1 tablet (150 mg total) by mouth 2 (two) times daily. Patient not taking: Reported on 04/08/2018 05/17/17   Wallis Bamberg, PA-C    Allergies    Patient has no known allergies.  Review of Systems   Review of Systems  Constitutional: Negative for fever.  HENT: Negative for ear pain and sore throat.   Eyes: Negative for visual disturbance.  Respiratory: Negative for cough and shortness of breath.   Cardiovascular: Negative for chest pain.  Gastrointestinal: Positive for abdominal pain, nausea and vomiting. Negative for constipation and diarrhea.  Genitourinary: Negative for dysuria and hematuria.  Musculoskeletal: Negative for back pain.  Skin: Negative for rash.  Neurological: Negative for headaches.  All other systems reviewed and are negative.   Physical Exam Updated Vital Signs BP 138/81 (BP Location: Right Arm)   Pulse 78   Temp 99.1 F (37.3 C) (Oral)   Resp 18   SpO2 99%   Physical Exam Vitals and nursing note reviewed.  Constitutional:      General: She is not in acute distress.  Appearance: She is well-developed.  HENT:     Head: Normocephalic and atraumatic.  Eyes:     General: Scleral icterus present.     Conjunctiva/sclera: Conjunctivae normal.  Cardiovascular:     Rate and Rhythm: Normal rate and regular rhythm.     Heart sounds: Normal heart sounds. No murmur heard.   Pulmonary:     Effort: Pulmonary effort is normal. No respiratory distress.     Breath sounds: Normal breath sounds. No wheezing, rhonchi or rales.  Abdominal:     General: Bowel sounds are normal.     Palpations: Abdomen is soft.     Tenderness: There is abdominal tenderness in the right upper quadrant and epigastric area. There is guarding. There is no rebound.  Musculoskeletal:     Cervical back: Neck supple.  Skin:    General: Skin is warm and dry.  Neurological:     Mental Status: She is alert.     ED Results /  Procedures / Treatments   Labs (all labs ordered are listed, but only abnormal results are displayed) Labs Reviewed  COMPREHENSIVE METABOLIC PANEL - Abnormal; Notable for the following components:      Result Value   Glucose, Bld 110 (*)    Calcium 8.7 (*)    AST 1,553 (*)    ALT 2,720 (*)    Alkaline Phosphatase 201 (*)    Total Bilirubin 5.8 (*)    All other components within normal limits  URINALYSIS, ROUTINE W REFLEX MICROSCOPIC - Abnormal; Notable for the following components:   Color, Urine AMBER (*)    APPearance HAZY (*)    All other components within normal limits  SARS CORONAVIRUS 2 BY RT PCR (HOSPITAL ORDER, PERFORMED IN [facility])  LIPASE, BLOOD  CBC  HEPATITIS PANEL, ACUTE  ACETAMINOPHEN LEVEL  CK  PROTIME-INR  I-STAT BETA HCG BLOOD, ED ([facility], [facility], [facility] ONLY)    EKG None  Radiology CT ABDOMEN PELVIS W CONTRAST  Result Date: 08/25/2019 CLINICAL DATA:  Epigastric pain. EXAM: CT ABDOMEN AND PELVIS WITH CONTRAST TECHNIQUE: Multidetector CT imaging of the abdomen and pelvis was performed using the standard protocol following bolus administration of intravenous contrast. CONTRAST:  OMNIPAQUE IOHEXOL 300 MG/ML  SOLN COMPARISON:  None. FINDINGS: Lower chest: The lung bases are clear. The heart size is normal. Hepatobiliary: The liver is normal. There is cholelithiasis.There is suggestion of mild gallbladder wall thickening and possible trace pericholecystic free fluid. Pancreas: Normal contours without ductal dilatation. No peripancreatic fluid collection. Spleen: Unremarkable. Adrenals/Urinary Tract: --Adrenal glands: Unremarkable. --Right kidney/ureter: No hydronephrosis or radiopaque kidney stones. --Left kidney/ureter: No hydronephrosis or radiopaque kidney stones. --Urinary bladder: Unremarkable. Stomach/Bowel: --Stomach/Duodenum: No hiatal hernia or other gastric abnormality. Normal duodenal course and caliber. --Small bowel: Unremarkable. --Colon: There is  a moderate amount of stool in the colon. --Appendix: Normal. Vascular/Lymphatic: Normal course and caliber of the major abdominal vessels. --No retroperitoneal lymphadenopathy. --No mesenteric lymphadenopathy. --No pelvic or inguinal lymphadenopathy. Reproductive: Again noted are findings suspicious for a septate or arcuate uterus. Other: No ascites or free air. The abdominal wall is normal. Musculoskeletal. There is atrophy of the lower extremity musculature, right worse than left. IMPRESSION: 1. Cholelithiasis with questionable wall thickening and pericholecystic free fluid. If there is high clinical suspicion for acute cholecystitis, follow-up with HIDA scan is recommended. 2. No additional acute abnormality detected. Chronic findings as detailed above. Electronically Signed   By: Katherine Mantle M.D.   On: 08/25/2019 20:14  US Abdomen Limited RUQ  Result Date: 08/25/2019 CLINICAL DATA:  Right upper quadrant pain. EXAM: ULTRASOUND ABDOMEN LIMITED RIGHT UPPER QUADRANT COMPARISON:  None FINDINGS: Gallbladder: Intraluminal shadowing calculus measuring 1.9 cm. No wall thickening visualized. No pericholecystic free fluid. No sonographic Murphy sign noted by sonographer. Common bile duct: Diameter: 4.5 mm Liver: No focal lesion identified. Within normal limits in parenchymal echogenicity. Portal vein is patent on color Doppler imaging with normal direction of blood flow towards the liver. Other: None. IMPRESSION: Cholelithiasis without sonographic evidence of cholecystitis. Electronically Signed   By: Stana Buntinghikanele  Emekauwa M.D.   On: 08/25/2019 16:44    Procedures Procedures (including critical care time)  Medications Ordered in ED Medications  sodium chloride flush (NS) 0.9 % injection 3 mL (has no administration in time range)  cefTRIAXone (ROCEPHIN) 2 g in sodium chloride 0.9 % 100 mL IVPB (has no administration in time range)  ondansetron (ZOFRAN) injection 4 mg (4 mg Intravenous Given 08/25/19 1641)    sodium chloride 0.9 % bolus 1,000 mL (0 mLs Intravenous Stopped 08/25/19 2030)  iohexol (OMNIPAQUE) 300 MG/ML solution 100 mL (100 mLs Intravenous Contrast Given 08/25/19 1952)    ED Course  I have reviewed the triage vital signs and the nursing notes.  Pertinent labs & imaging results that were available during my care of the patient were reviewed by me and considered in my medical decision making (see chart for details).  Clinical Course as of Aug 24 2104  Thu Aug 25, 2019  2042 49 yo female presenting to Ed with abdominal/epigastric pain for 1.5 weeks.  She states it began when eating a steak last week.  She reports nausea at home.  Her eyes are yellow.  Denies jaundice or pruritis.  On exam she has some mild scleral icterus.  She has fullness and TTP of the RUQ.  Otherwise no luekocytosis or fever to suggest cholangitis.  RUQ US shows gallstones without other stigmata of cholecystitis.  LFT's elevated here AST 1553, ALT 2720, Tibi 5.8.  PA provider spoke to Gi and Gen surgery service who did not feel this was acute cholecystitis or biliary disease and recommended admission to hospital for hepatitis (including viral) w/u.  Patient informed and agreebale with staying.  Her pain is minimal and she wants no pain medications.   [MT]  2057 MCH: 30.4 [MT]    Clinical Course User Index [MT] Terald Sleeperrifan, Matthew J, MD   MDM Rules/Calculators/A&P                          This patient complains of abd pain and abnormal labs; this involves an extensive number of treatment Options and is a complaint that carries with it a high risk of complications and Morbidity. The differential includes cholecystitis, cholangitis, hepatitis, or other  biliary pathology   I ordered, reviewed and interpreted labs, which included CBC without leukocytosis or anemia, CMP with elevated LFTs and bilirubin.  Normal creatinine and electrolytes.  Negative lipase.  Beta-hCG negative, UA  Neg for UTI. I ordered medication including  Zofran and also ordered IV fluids. I ordered imaging studies which included a right upper quadrant ultrasound, CT abd/pelvis and I independently    visualized and interpreted imaging which showed   - RUQ US: cholelithiasis w/o cholecystitis, no dilation of the CBD  - CT abd/pelvis:  1. Cholelithiasis with questionable wall thickening and pericholecystic free fluid. If there is high clinical suspicion for acute cholecystitis, follow-up with HIDA  scan is recommended. 2. No additional acute abnormality detected.  Previous records obtained and reviewed which showed prior CT abdomen/pelvis done in 2017 which showed gallstones in the gallbladder.  5:27 PM CONSULT with Dr. Bosie Clos with gastroenterology who recommends obtaining a CT abd/pelvis. He will consult on the patient.    8:23 PM CONSULT with Dr. Sheliah Hatch with general surgery who will consult on patient.    9:00 PM CONSULT with Dr. Bernell List who accepts patient for admission.  Final Clinical Impression(s) / ED Diagnoses Final diagnoses:  RUQ pain    Rx / DC Orders ED Discharge Orders    None       Rayne Du 08/25/19 2106    Terald Sleeper, MD 08/25/19 [phone]

## 2019-08-25 NOTE — ED Notes (Signed)
Per Dr. Tracie Harrier, pt is to go to ER STAT or primary care for higher level eval/tx based on elevated liver enzymes and bilirubin. Pt in stable condition, verbalizes understanding.

## 2019-08-25 NOTE — H&P (Signed)
Rachel Le WVP:[id number] DOB: Jan 16, 1971 DOA: 08/25/2019     PCP: Sandre Kitty, PA-C   Outpatient Specialists:  NONE    Patient arrived to ER on 08/25/19 at 1004 Referred by Attending Trifan, Kermit Balo, MD   Patient coming from: home Lives alone     Chief Complaint:   Chief Complaint  Patient presents with  . Abdominal Pain  . abnormal labs    HPI: Rachel Le is a 49 y.o. female with medical history significant of HTN     Presented with   4-5 days of RUQ /epigastric  Pain radiating to her back worse when she eats especially if she is eating fatty foods associated with nausea and some vomiting no diarrhea.  No fever no chills.  Started when she ate a steak few days ago.  Since then eating anything fatty makes her chronically bloated and nauseous. Patient states she only drinks couple of glasses of wine in the weekend.  She does not smoke. Does not use any herbal supplements.  Uses Tylenol only to control her pain when she has it.  Which is just few times per month. Infectious risk factors:  Reports , N/V    Has   been vaccinated against COVID    Initial COVID TEST  NEGATIVE   Lab Results  Component Value Date   SARSCOV2NAA NEGATIVE 08/25/2019    Regarding pertinent Chronic problems:    HTN on lisinopril/hydrochlorothiazide     While in ER: Noted to have significant LFT elevation 1500 range Imaging showed gallstones no leukocytosis no CBD dilatation Started on empiric antibiotics kept n.p.o.  ER Provider Called:  GI    Dr. Bosie Clos They Recommend admit to medicine  Will see in AM obtain Ct abd/pelvis  ER Provider Called: General surgery   Dr.Kinsinger They Recommend admit to medicine     seen in  ER recommends keeping n.p.o. postmidnight continue Zosyn we will reevaluate   Hospitalist was called for admission for transaminitis The following Work up has been ordered so far:  Orders Placed This Encounter    Procedures  . SARS Coronavirus 2 by RT PCR (hospital order, performed in [facility] hospital lab) Nasopharyngeal Nasopharyngeal Swab  . US Abdomen Limited RUQ  . CT ABDOMEN PELVIS W CONTRAST  . Lipase, blood  . Comprehensive metabolic panel  . CBC  . Urinalysis, Routine w reflex microscopic  . Hepatitis panel, acute  . Acetaminophen level  . CK  . Protime-INR  . Diet NPO time specified Except for: [employer], Sips with Meds  . Saline Lock IV, Maintain IV access  . Check temperature  . Vital signs  . Ice chips  . Consult to gastroenterology  ALL PATIENTS BEING ADMITTED/HAVING PROCEDURES NEED COVID-19 SCREENING  . Consult to general surgery  ALL PATIENTS BEING ADMITTED/HAVING PROCEDURES NEED COVID-19 SCREENING  . Consult for Unassigned Medical Admission  ALL PATIENTS BEING ADMITTED/HAVING PROCEDURES NEED COVID-19 SCREENING  . I-Stat beta hCG blood, ED     Following Medications were ordered in ER: Medications  sodium chloride flush (NS) 0.9 % injection 3 mL (has no administration in time range)  cefTRIAXone (ROCEPHIN) 2 g in sodium chloride 0.9 % 100 mL IVPB (has no administration in time range)  ondansetron (ZOFRAN) injection 4 mg (4 mg Intravenous Given 08/25/19 1641)  sodium chloride 0.9 % bolus 1,000 mL (0 mLs Intravenous Stopped 08/25/19 2030)  iohexol (OMNIPAQUE) 300 MG/ML solution 100 mL (100 mLs Intravenous Contrast Given  08/25/19 1952)      Significant initial  Findings: Abnormal Labs Reviewed  COMPREHENSIVE METABOLIC PANEL - Abnormal; Notable for the following components:      Result Value   Glucose, Bld 110 (*)    Calcium 8.7 (*)    AST 1,553 (*)    ALT 2,720 (*)    Alkaline Phosphatase 201 (*)    Total Bilirubin 5.8 (*)    All other components within normal limits  URINALYSIS, ROUTINE W REFLEX MICROSCOPIC - Abnormal; Notable for the following components:   Color, Urine AMBER (*)    APPearance HAZY (*)    All other components within normal limits    Otherwise labs  showing:    Recent Labs  Lab 08/25/19 1057  NA 135  K 4.2  CO2 29  GLUCOSE 110*  BUN 9  CREATININE 0.44  CALCIUM 8.7*    Cr     Stable,  Lab Results  Component Value Date   CREATININE 0.44 08/25/2019    Recent Labs  Lab 08/25/19 1057  AST 1,553*  ALT 2,720*  ALKPHOS 201*  BILITOT 5.8*  PROT 6.8  ALBUMIN 3.5   Lab Results  Component Value Date   CALCIUM 8.7 (L) 08/25/2019      WBC      Component Value Date/Time   WBC 5.7 08/25/2019 1057    Plt: Lab Results  Component Value Date   PLT 233 08/25/2019       HG/HCT  Stable,     Component Value Date/Time   HGB 13.8 08/25/2019 1057   HCT 42.0 08/25/2019 1057    Recent Labs  Lab 08/25/19 1057  LIPASE 36   Cardiac Panel (last 3 results) No results for input(s): CKTOTAL, CKMB, TROPONINI, RELINDX in the last 72 hours.     ECG: not   Ordered      UA   no evidence of UTI    Urine analysis:    Component Value Date/Time   COLORURINE AMBER (A) 08/25/2019 1833   APPEARANCEUR HAZY (A) 08/25/2019 1833   LABSPEC 1.010 08/25/2019 1833   PHURINE 7.0 08/25/2019 1833   GLUCOSEU NEGATIVE 08/25/2019 1833   HGBUR NEGATIVE 08/25/2019 1833   BILIRUBINUR NEGATIVE 08/25/2019 1833   KETONESUR NEGATIVE 08/25/2019 1833   PROTEINUR NEGATIVE 08/25/2019 1833   NITRITE NEGATIVE 08/25/2019 1833   LEUKOCYTESUR NEGATIVE 08/25/2019 1833       Ordered    CTabd/pelvis - Cholelithiasis with questionable wall thickening and pericholecystic free fluid.    RUQ [place] - Cholelithiasis without sonographic evidence of cholecystitis.    ED Triage Vitals  Enc Vitals Group     BP 08/25/19 1011 122/62     Pulse Rate 08/25/19 1011 86     Resp 08/25/19 1011 16     Temp 08/25/19 1011 99 F (37.2 C)     Temp Source 08/25/19 1011 Oral     SpO2 08/25/19 1011 99 %     Weight --      Height --      Head Circumference --      Peak Flow --      Pain Score 08/25/19 1050 7     Pain Loc --      Pain Edu? --      Excl. in GC? --     TMAX(24)@       Latest  Blood pressure 138/81, pulse 78, temperature 99.1 F (37.3 C), temperature source Oral, resp. rate 18, SpO2 99 %.  Review of Systems:    Pertinent positives include:   bdominal pain, nausea, vomiting,  Constitutional:  No weight loss, night sweats, Fevers, chills, fatigue, weight loss  HEENT:  No headaches, Difficulty swallowing,Tooth/dental problems,Sore throat,  No sneezing, itching, ear ache, nasal congestion, post nasal drip,  Cardio-vascular:  No chest pain, Orthopnea, PND, anasarca, dizziness, palpitations.no Bilateral lower extremity swelling  GI:  No heartburn, indigestion, a diarrhea, change in bowel habits, loss of appetite, melena, blood in stool, hematemesis Resp:  no shortness of breath at rest. No dyspnea on exertion, No excess mucus, no productive cough, No non-productive cough, No coughing up of blood.No change in color of mucus.No wheezing. Skin:  no rash or lesions. No jaundice GU:  no dysuria, change in color of urine, no urgency or frequency. No straining to urinate.  No flank pain.  Musculoskeletal:  No joint pain or no joint swelling. No decreased range of motion. No back pain.  Psych:  No change in mood or affect. No depression or anxiety. No memory loss.  Neuro: no localizing neurological complaints, no tingling, no weakness, no double vision, no gait abnormality, no slurred speech, no confusion  All systems reviewed and apart from HOPI all are negative  Past Medical History:   Past Medical History:  Diagnosis Date  . Hypertension        Past Surgical History:  Procedure Laterality Date  . bilateral knee surgery    . HIP FRACTURE SURGERY      Social History:  Ambulatory  independently       reports that she has never smoked. She has never used smokeless tobacco. She reports current alcohol use. She reports that she does not use drugs.   Family History:   Family History  Problem Relation Age of Onset  .  Hypertension Mother   . Breast cancer Neg Hx     Allergies: No Known Allergies   Prior to Admission medications   Medication Sig Start Date End Date Taking? Authorizing Provider  lisinopril-hydrochlorothiazide (PRINZIDE,ZESTORETIC) 10-12.5 MG tablet Take 1 tablet by mouth daily.    [provider]  omeprazole (PRILOSEC) 20 MG capsule Take 1 capsule (20 mg total) by mouth daily. Patient not taking: Reported on 04/08/2018 05/17/17   Wallis Bamberg, PA-C  ranitidine (ZANTAC) 150 MG tablet Take 1 tablet (150 mg total) by mouth 2 (two) times daily. Patient not taking: Reported on 04/08/2018 05/17/17   Wallis Bamberg, PA-C   Physical Exam: Vitals with BMI 08/25/2019 08/25/2019 08/25/2019  Weight - - -  Systolic 138 95 124  Diastolic 81 58 74  Pulse 78 79 83   1. General:  in No  Acute distress   well  -appearing 2. Psychological: Alert and   Oriented 3. Head/ENT:    Dry Mucous Membranes                          Head Non traumatic, neck supple                           Poor Dentition 4. SKIN:   decreased Skin turgor,  Skin clean Dry and intact no rash 5. Heart: Regular rate and rhythm no  Murmur, no Rub or gallop 6. Lungs:   no wheezes or crackles   7. Abdomen: Soft,  non-tender, Non distended  obese  bowel sounds present 8. Lower extremities: no clubbing, cyanosis, no edema 9. Neurologically Grossly intact,  moving all 4 extremities equally  10. MSK: Normal range of motion   All other LABS:     Recent Labs  Lab 08/25/19 1057  WBC 5.7  HGB 13.8  HCT 42.0  MCV 92.5  PLT 233     Recent Labs  Lab 08/25/19 1057  NA 135  K 4.2  CL 100  CO2 29  GLUCOSE 110*  BUN 9  CREATININE 0.44  CALCIUM 8.7*     Recent Labs  Lab 08/25/19 1057  AST 1,553*  ALT 2,720*  ALKPHOS 201*  BILITOT 5.8*  PROT 6.8  ALBUMIN 3.5       Cultures: No results found for: SDES, SPECREQUEST, CULT, REPTSTATUS   Radiological Exams on Admission: CT ABDOMEN PELVIS W CONTRAST  Result Date:  08/25/2019 CLINICAL DATA:  Epigastric pain. EXAM: CT ABDOMEN AND PELVIS WITH CONTRAST TECHNIQUE: Multidetector CT imaging of the abdomen and pelvis was performed using the standard protocol following bolus administration of intravenous contrast. CONTRAST:  100mL OMNIPAQUE IOHEXOL 300 MG/ML  SOLN COMPARISON:  None. FINDINGS: Lower chest: The lung bases are clear. The heart size is normal. Hepatobiliary: The liver is normal. There is cholelithiasis.There is suggestion of mild gallbladder wall thickening and possible trace pericholecystic free fluid. Pancreas: Normal contours without ductal dilatation. No peripancreatic fluid collection. Spleen: Unremarkable. Adrenals/Urinary Tract: --Adrenal glands: Unremarkable. --Right kidney/ureter: No hydronephrosis or radiopaque kidney stones. --Left kidney/ureter: No hydronephrosis or radiopaque kidney stones. --Urinary bladder: Unremarkable. Stomach/Bowel: --Stomach/Duodenum: No hiatal hernia or other gastric abnormality. Normal duodenal course and caliber. --Small bowel: Unremarkable. --Colon: There is a moderate amount of stool in the colon. --Appendix: Normal. Vascular/Lymphatic: Normal course and caliber of the major abdominal vessels. --No retroperitoneal lymphadenopathy. --No mesenteric lymphadenopathy. --No pelvic or inguinal lymphadenopathy. Reproductive: Again noted are findings suspicious for a septate or arcuate uterus. Other: No ascites or free air. The abdominal wall is normal. Musculoskeletal. There is atrophy of the lower extremity musculature, right worse than left. IMPRESSION: 1. Cholelithiasis with questionable wall thickening and pericholecystic free fluid. If there is high clinical suspicion for acute cholecystitis, follow-up with HIDA scan is recommended. 2. No additional acute abnormality detected. Chronic findings as detailed above. Electronically Signed   By: Katherine Mantlehristopher  Green M.D.   On: 08/25/2019 20:14   US Abdomen Limited RUQ  Result Date:  08/25/2019 CLINICAL DATA:  Right upper quadrant pain. EXAM: ULTRASOUND ABDOMEN LIMITED RIGHT UPPER QUADRANT COMPARISON:  None FINDINGS: Gallbladder: Intraluminal shadowing calculus measuring 1.9 cm. No wall thickening visualized. No pericholecystic free fluid. No sonographic Murphy sign noted by sonographer. Common bile duct: Diameter: 4.5 mm Liver: No focal lesion identified. Within normal limits in parenchymal echogenicity. Portal vein is patent on color Doppler imaging with normal direction of blood flow towards the liver. Other: None. IMPRESSION: Cholelithiasis without sonographic evidence of cholecystitis. Electronically Signed   By: Stana Buntinghikanele  Emekauwa M.D.   On: 08/25/2019 16:44    Chart has been reviewed    Assessment/Plan   49 y.o. female with medical history significant of HTN  Admitted for transaminitis and right upper quadrant pain  Present on Admission: . Transaminitis -order acute hepatitis serologies Acetaminophen level, INR, Albumin within normal limits appreciate GI consult.  Rehydrate and continue to follow LFTs.  For completion check CK as well.  Obtain direct and direct bilirubin,  . Essential hypertension -allow permissive hypertension for tonight monitor kidney function   . RUQ pain -imaging did not show any evidence of acute cholecystitis.  Continue to follow LFTs appreciate general  surgery and GI recommendations.  If viral work-up is unremarkable and patient continues to have symptoms worrisome for gallbladder dysfunction may benefit from HIDA scan.  At this point no fever white blood cell count elevation to suggest intra-abdominal infection.  General surgery did recommend empiric antibiotics will continue for the first 24 hours and then continue to observe. On Zosyn from 08/25/19   Other plan as per orders.  DVT prophylaxis:  SCD      Code Status:    Code Status: Not on file FULL CODE   as per patient   I had personally discussed CODE STATUS with patient     Family  Communication:   Family   at  Bedside  plan of care was discussed with  Husband,    Disposition Plan:    To home once workup is complete and patient is stable   Following barriers for discharge:                                                        Pain controlled with PO medications                              able to transition to PO antibiotics                             Will need to be able to tolerate PO                                                        Will need consultants to evaluate patient prior to discharge                                         Consults called: GI , general Surgery  Admission status:  ED Disposition    ED Disposition Condition Comment   Admit  Hospital Area: [facility] [[id number]]  Level of Care: Med-Surg [16]  I expect the patient will be discharged within 24 hours: No (not a candidate for 5C-Observation unit)  Covid Evaluation: Confirmed COVID Negative  Diagnosis: Transaminitis [161096]  Admitting Physician: Therisa Doyne [[id number]]  Attending Physician: Therisa Doyne [[id number]]       Obs     Level of care   medical floor  COVID-19 Labs    Lab Results  Component Value Date   SARSCOV2NAA NEGATIVE 08/25/2019     Precautions: admitted as Covid Negative      PPE: Used by the provider:   P100  eye Goggles,  Gloves      Daisee Centner 08/25/2019, 10:26 PM    [facility]     after 2 AM please page floor coverage PA If 7AM-7PM, please contact the day team taking care of the patient using [email or web address]   Patient was evaluated in the context of the global COVID-19 pandemic, which necessitated consideration that the patient might be at risk for infection with the SARS-CoV-2 virus that causes  COVID-19. Institutional protocols and algorithms that pertain to the evaluation of patients at risk for COVID-19 are in a state of rapid change based on information released by regulatory bodies including the CDC and  federal and state organizations. These policies and algorithms were followed during the patient's care.

## 2019-08-25 NOTE — Consult Note (Signed)
Reason for Consult: transaminitis, gallstones Referring Physician: Alvester Chou  [name] is an 49 y.o. female.  HPI: 48 yo female with 4 days of epigastric and right abdominal pain. Pain has been constant and worsening in intensity. It is worse with eating. She ahs nausea and vomiting as well. She has never had pain like this before. She denies diarrhea. She denies recent travel. She does not know her hepatitis A and B immunization status.  Past Medical History:  Diagnosis Date  . Hypertension     Past Surgical History:  Procedure Laterality Date  . bilateral knee surgery    . HIP FRACTURE SURGERY      Family History  Problem Relation Age of Onset  . Hypertension Mother   . Breast cancer Neg Hx     Social History:  reports that she has never smoked. She has never used smokeless tobacco. She reports current alcohol use. She reports that she does not use drugs.  Allergies: No Known Allergies  Medications: I have reviewed the patient's current medications.  Results for orders placed or performed during the hospital encounter of 08/25/19 (from the past 48 hour(s))  Lipase, blood     Status: None   Collection Time: 08/25/19 10:57 AM  Result Value Ref Range   Lipase 36 11 - 51 U/L    Comment: Performed at [facility], [address], [place], [place] [address]  Comprehensive metabolic panel     Status: Abnormal   Collection Time: 08/25/19 10:57 AM  Result Value Ref Range   Sodium 135 135 - 145 mmol/L   Potassium 4.2 3.5 - 5.1 mmol/L   Chloride 100 98 - 111 mmol/L   CO2 29 22 - 32 mmol/L   Glucose, Bld 110 (H) 70 - 99 mg/dL    Comment: Glucose reference range applies only to samples taken after fasting for at least 8 hours.   BUN 9 6 - 20 mg/dL   Creatinine, Ser 7.32 0.44 - 1.00 mg/dL   Calcium 8.7 (L) 8.9 - 10.3 mg/dL   Total Protein 6.8 6.5 - 8.1 g/dL   Albumin 3.5 3.5 - 5.0 g/dL   AST 2,025 (H) 15 - 41 U/L   ALT 2,720 (H) 0 - 44 U/L     Comment: RESULTS CONFIRMED BY MANUAL DILUTION   Alkaline Phosphatase 201 (H) 38 - 126 U/L   Total Bilirubin 5.8 (H) 0.3 - 1.2 mg/dL   GFR calc non Af Amer >60 >60 mL/min   GFR calc Af Amer >60 >60 mL/min   Anion gap 6 5 - 15    Comment: Performed at [facility], [address] [place], [place] [address]  CBC     Status: None   Collection Time: 08/25/19 10:57 AM  Result Value Ref Range   WBC 5.7 4.0 - 10.5 K/uL   RBC 4.54 3.87 - 5.11 MIL/uL   Hemoglobin 13.8 12.0 - 15.0 g/dL   HCT 23.7 36 - 46 %   MCV 92.5 80.0 - 100.0 fL   MCH 30.4 26.0 - 34.0 pg   MCHC 32.9 30.0 - 36.0 g/dL   RDW 62.8 31.5 - 17.6 %   Platelets 233 150 - 400 K/uL   nRBC 0.0 0.0 - 0.2 %    Comment: Performed at [facility], [address], [place], [place] [address]  I-Stat beta hCG blood, ED     Status: None   Collection Time: 08/25/19 11:11  AM  Result Value Ref Range   I-stat hCG, quantitative <5.0 <5 mIU/mL   Comment 3            Comment:   GEST. AGE      CONC.  (mIU/mL)   <=1 WEEK        5 - 50     2 WEEKS       50 - 500     3 WEEKS       100 - 10,000     4 WEEKS     1,000 - 30,000        FEMALE AND NON-PREGNANT FEMALE:     LESS THAN 5 mIU/mL   SARS Coronavirus 2 by RT PCR (hospital order, performed in [facility] hospital lab) Nasopharyngeal Nasopharyngeal Swab     Status: None   Collection Time: 08/25/19  5:33 PM   Specimen: Nasopharyngeal Swab  Result Value Ref Range   SARS Coronavirus 2 NEGATIVE NEGATIVE    Comment: (NOTE) SARS-CoV-2 target nucleic acids are NOT DETECTED.  The SARS-CoV-2 RNA is generally detectable in upper and lower respiratory specimens during the acute phase of infection. The lowest concentration of SARS-CoV-2 viral copies this assay can detect is 250 copies / mL. A negative result does not preclude SARS-CoV-2 infection and should not be used as the sole basis for treatment or other patient management decisions.  A negative result may occur with improper  specimen collection / handling, submission of specimen other than nasopharyngeal swab, presence of viral mutation(s) within the areas targeted by this assay, and inadequate number of viral copies (<250 copies / mL). A negative result must be combined with clinical observations, patient history, and epidemiological information.  Fact Sheet for Patients:   BoilerBrush.com.cy  Fact Sheet for Healthcare Providers: https://pope.com/  This test is not yet approved or  cleared by the [place] FDA and has been authorized for detection and/or diagnosis of SARS-CoV-2 by FDA under an Emergency Use Authorization (EUA).  This EUA will remain in effect (meaning this test can be used) for the duration of the COVID-19 declaration under Section 564(b)(1) of the Act, 21 U.S.C. section 360bbb-3(b)(1), unless the authorization is terminated or revoked sooner.  Performed at [facility], [address], [place], [place] [address]   Urinalysis, Routine w reflex microscopic     Status: Abnormal   Collection Time: 08/25/19  6:33 PM  Result Value Ref Range   Color, Urine AMBER (A) YELLOW    Comment: BIOCHEMICALS MAY BE AFFECTED BY COLOR   APPearance HAZY (A) CLEAR   Specific Gravity, Urine 1.010 1.005 - 1.030   pH 7.0 5.0 - 8.0   Glucose, UA NEGATIVE NEGATIVE mg/dL   Hgb urine dipstick NEGATIVE NEGATIVE   Bilirubin Urine NEGATIVE NEGATIVE   Ketones, ur NEGATIVE NEGATIVE mg/dL   Protein, ur NEGATIVE NEGATIVE mg/dL   Nitrite NEGATIVE NEGATIVE   Leukocytes,Ua NEGATIVE NEGATIVE    Comment: Performed at [facility], [address], [place], [place] [address]    CT ABDOMEN PELVIS W CONTRAST  Result Date: 08/25/2019 CLINICAL DATA:  Epigastric pain. EXAM: CT ABDOMEN AND PELVIS WITH CONTRAST TECHNIQUE: Multidetector CT imaging of the abdomen and pelvis was performed using the standard protocol following bolus administration of intravenous  contrast. CONTRAST:  OMNIPAQUE IOHEXOL 300 MG/ML  SOLN COMPARISON:  None. FINDINGS: Lower chest: The lung bases are clear. The heart size is normal. Hepatobiliary: The liver is normal. There is cholelithiasis.There is  suggestion of mild gallbladder wall thickening and possible trace pericholecystic free fluid. Pancreas: Normal contours without ductal dilatation. No peripancreatic fluid collection. Spleen: Unremarkable. Adrenals/Urinary Tract: --Adrenal glands: Unremarkable. --Right kidney/ureter: No hydronephrosis or radiopaque kidney stones. --Left kidney/ureter: No hydronephrosis or radiopaque kidney stones. --Urinary bladder: Unremarkable. Stomach/Bowel: --Stomach/Duodenum: No hiatal hernia or other gastric abnormality. Normal duodenal course and caliber. --Small bowel: Unremarkable. --Colon: There is a moderate amount of stool in the colon. --Appendix: Normal. Vascular/Lymphatic: Normal course and caliber of the major abdominal vessels. --No retroperitoneal lymphadenopathy. --No mesenteric lymphadenopathy. --No pelvic or inguinal lymphadenopathy. Reproductive: Again noted are findings suspicious for a septate or arcuate uterus. Other: No ascites or free air. The abdominal wall is normal. Musculoskeletal. There is atrophy of the lower extremity musculature, right worse than left. IMPRESSION: 1. Cholelithiasis with questionable wall thickening and pericholecystic free fluid. If there is high clinical suspicion for acute cholecystitis, follow-up with HIDA scan is recommended. 2. No additional acute abnormality detected. Chronic findings as detailed above. Electronically Signed   By: Katherine Mantle M.D.   On: 08/25/2019 20:14   US Abdomen Limited RUQ  Result Date: 08/25/2019 CLINICAL DATA:  Right upper quadrant pain. EXAM: ULTRASOUND ABDOMEN LIMITED RIGHT UPPER QUADRANT COMPARISON:  None FINDINGS: Gallbladder: Intraluminal shadowing calculus measuring 1.9 cm. No wall thickening visualized. No  pericholecystic free fluid. No sonographic Murphy sign noted by sonographer. Common bile duct: Diameter: 4.5 mm Liver: No focal lesion identified. Within normal limits in parenchymal echogenicity. Portal vein is patent on color Doppler imaging with normal direction of blood flow towards the liver. Other: None. IMPRESSION: Cholelithiasis without sonographic evidence of cholecystitis. Electronically Signed   By: Stana Bunting M.D.   On: 08/25/2019 16:44    Review of Systems  Constitutional: Positive for malaise/fatigue. Negative for chills and fever.  HENT: Negative for hearing loss.   Eyes: Negative for blurred vision and double vision.  Respiratory: Negative for cough and hemoptysis.   Cardiovascular: Negative for chest pain and palpitations.  Gastrointestinal: Positive for abdominal pain, nausea and vomiting.  Genitourinary: Negative for dysuria and urgency.  Musculoskeletal: Negative for myalgias and neck pain.  Skin: Negative for itching and rash.  Neurological: Negative for dizziness, tingling and headaches.  Endo/Heme/Allergies: Does not bruise/bleed easily.  Psychiatric/Behavioral: Negative for depression and suicidal ideas.    PE Blood pressure 138/81, pulse 78, temperature 99.1 F (37.3 C), temperature source Oral, resp. rate 18, SpO2 99 %. Constitutional: NAD; conversant; no deformities Eyes: Moist conjunctiva; no lid lag; anicteric; PERRL Neck: Trachea midline; no thyromegaly Lungs: Normal respiratory effort; no tactile fremitus CV: RRR; no palpable thrills; no pitting edema GI: Abd epigastric and RUQ tenderness; no palpable hepatosplenomegaly MSK: uses crutches to walk with assymmetry in leg length; no clubbing/cyanosis Psychiatric: Appropriate affect; alert and oriented x3 Lymphatic: No palpable cervical or axillary lymphadenopathy   Assessment/Plan: 49 yo female with transaminitis to > 1000 with elevated bilirubin. CBD normal on imaging x 2. 1 large gallstone in  imaging. No leukocytosis. I am concerned that this is liver primary given the high amount of liver necrosis base on transaminitis without CBD dilation or leukocytosis. We will follow along during the work up -empiric antibiotics for biliary coverage -NPO except ice, meds and water -avoid hepatotoxic medications  De Blanch Makai Agostinelli 08/25/2019, 8:58 PM

## 2019-08-25 NOTE — ED Triage Notes (Signed)
Sent to ED for PCP for eval of elevated liver enzymes. Pt stats she started vomiting last Wednesday and started with abd pain. Intermittent vomiting now. Pt is alert and oriented. Appears in nad. Complains of diarrhea intermittently.

## 2019-08-26 DIAGNOSIS — B169 Acute hepatitis B without delta-agent and without hepatic coma: Secondary | ICD-10-CM

## 2019-08-26 LAB — HEMOGLOBIN A1C
Hgb A1c MFr Bld: 5.2 % (ref 4.8–5.6)
Mean Plasma Glucose: 102.54 mg/dL

## 2019-08-26 LAB — COMPREHENSIVE METABOLIC PANEL
ALT: 2230 U/L — ABNORMAL HIGH (ref 0–44)
ALT: 2513 U/L — ABNORMAL HIGH (ref 0–44)
AST: 1258 U/L — ABNORMAL HIGH (ref 15–41)
AST: 1521 U/L — ABNORMAL HIGH (ref 15–41)
Albumin: 3 g/dL — ABNORMAL LOW (ref 3.5–5.0)
Albumin: 3.3 g/dL — ABNORMAL LOW (ref 3.5–5.0)
Alkaline Phosphatase: 190 U/L — ABNORMAL HIGH (ref 38–126)
Alkaline Phosphatase: 188 U/L — ABNORMAL HIGH (ref 38–126)
Anion gap: 8 (ref 5–15)
Anion gap: 10 (ref 5–15)
BUN: <5 mg/dL — ABNORMAL LOW (ref 6–20)
BUN: <5 mg/dL — ABNORMAL LOW (ref 6–20)
CO2: 23 mmol/L (ref 22–32)
CO2: 25 mmol/L (ref 22–32)
Calcium: 7.8 mg/dL — ABNORMAL LOW (ref 8.9–10.3)
Calcium: 8.3 mg/dL — ABNORMAL LOW (ref 8.9–10.3)
Chloride: 105 mmol/L (ref 98–111)
Chloride: 104 mmol/L (ref 98–111)
Creatinine, Ser: 0.38 mg/dL — ABNORMAL LOW (ref 0.44–1.00)
Creatinine, Ser: 0.41 mg/dL — ABNORMAL LOW (ref 0.44–1.00)
GFR calc Af Amer: >60 mL/min (ref >60)
GFR calc Af Amer: >60 mL/min (ref >60)
GFR calc non Af Amer: >60 mL/min (ref >60)
GFR calc non Af Amer: >60 mL/min (ref >60)
Glucose, Bld: 167 mg/dL — ABNORMAL HIGH (ref 70–99)
Glucose, Bld: 98 mg/dL (ref 70–99)
Potassium: 3.7 mmol/L (ref 3.5–5.1)
Potassium: 3.9 mmol/L (ref 3.5–5.1)
Sodium: 136 mmol/L (ref 135–145)
Sodium: 139 mmol/L (ref 135–145)
Total Bilirubin: 5.3 mg/dL — ABNORMAL HIGH (ref 0.3–1.2)
Total Bilirubin: 6.5 mg/dL — ABNORMAL HIGH (ref 0.3–1.2)
Total Protein: 5.7 g/dL — ABNORMAL LOW (ref 6.5–8.1)
Total Protein: 6.6 g/dL (ref 6.5–8.1)

## 2019-08-26 LAB — BILIRUBIN, FRACTIONATED(TOT/DIR/INDIR)
Bilirubin, Direct: 3.2 mg/dL — ABNORMAL HIGH (ref 0.0–0.2)
Indirect Bilirubin: 2.1 mg/dL — ABNORMAL HIGH (ref 0.3–0.9)
Total Bilirubin: 5.3 mg/dL — ABNORMAL HIGH (ref 0.3–1.2)

## 2019-08-26 LAB — CBC WITH DIFFERENTIAL/PLATELET
Abs Immature Granulocytes: 0.03 10*3/uL (ref 0.00–0.07)
Basophils Absolute: 0 10*3/uL (ref 0.0–0.1)
Basophils Relative: 0 %
Eosinophils Absolute: 0.1 10*3/uL (ref 0.0–0.5)
Eosinophils Relative: 1 %
HCT: 40.9 % (ref 36.0–46.0)
Hemoglobin: 13.4 g/dL (ref 12.0–15.0)
Immature Granulocytes: 1 %
Lymphocytes Relative: 20 %
Lymphs Abs: 0.9 10*3/uL (ref 0.7–4.0)
MCH: 30.2 pg (ref 26.0–34.0)
MCHC: 32.8 g/dL (ref 30.0–36.0)
MCV: 92.3 fL (ref 80.0–100.0)
Monocytes Absolute: 0.6 10*3/uL (ref 0.1–1.0)
Monocytes Relative: 13 %
Neutro Abs: 3.1 10*3/uL (ref 1.7–7.7)
Neutrophils Relative %: 65 %
Platelets: 231 10*3/uL (ref 150–400)
RBC: 4.43 MIL/uL (ref 3.87–5.11)
RDW: 14.1 % (ref 11.5–15.5)
WBC: 4.8 10*3/uL (ref 4.0–10.5)
nRBC: 0 % (ref 0.0–0.2)

## 2019-08-26 LAB — LIPID PANEL
Cholesterol: 137 mg/dL (ref 0–200)
HDL: 15 mg/dL — ABNORMAL LOW (ref >40)
LDL Cholesterol: 85 mg/dL (ref 0–99)
Total CHOL/HDL Ratio: 9.1 RATIO
Triglycerides: 185 mg/dL — ABNORMAL HIGH (ref <150)
VLDL: 37 mg/dL (ref 0–40)

## 2019-08-26 LAB — PROTIME-INR
INR: 1.1 (ref 0.8–1.2)
Prothrombin Time: 13.9 seconds (ref 11.4–15.2)

## 2019-08-26 LAB — HIV ANTIBODY (ROUTINE TESTING W REFLEX): HIV Screen 4th Generation wRfx: NONREACTIVE

## 2019-08-26 LAB — PHOSPHORUS: Phosphorus: 3.3 mg/dL (ref 2.5–4.6)

## 2019-08-26 LAB — MAGNESIUM: Magnesium: 2.3 mg/dL (ref 1.7–2.4)

## 2019-08-26 LAB — TSH: TSH: 2.28 u[IU]/mL (ref 0.350–4.500)

## 2019-08-26 LAB — LACTIC ACID, PLASMA: Lactic Acid, Venous: 1.1 mmol/L (ref 0.5–1.9)

## 2019-08-26 MED ORDER — ACETAMINOPHEN 325 MG PO TABS: 650.0000 mg | ORAL_TABLET | Freq: Four times a day (QID) | ORAL | Status: DC | PRN

## 2019-08-26 MED ORDER — SODIUM CHLORIDE 0.9% FLUSH
3.0000 mL | Freq: Two times a day (BID) | INTRAVENOUS | Status: DC
  Administered 2019-08-26 (×3): 3 mL via INTRAVENOUS

## 2019-08-26 MED ORDER — HYDROCODONE-ACETAMINOPHEN 5-325 MG PO TABS: 1.0000 | ORAL_TABLET | ORAL | Status: DC | PRN

## 2019-08-26 MED ORDER — ONDANSETRON HCL 4 MG/2ML IJ SOLN: 4.0000 mg | Freq: Four times a day (QID) | INTRAMUSCULAR | Status: DC | PRN

## 2019-08-26 MED ORDER — SODIUM CHLORIDE 0.9 % IV SOLN: INTRAVENOUS | Status: AC

## 2019-08-26 MED ORDER — ACETAMINOPHEN 650 MG RE SUPP: 650.0000 mg | Freq: Four times a day (QID) | RECTAL | Status: DC | PRN

## 2019-08-26 MED ORDER — ONDANSETRON HCL 4 MG PO TABS: 4.0000 mg | ORAL_TABLET | Freq: Four times a day (QID) | ORAL | Status: DC | PRN

## 2019-08-26 MED ORDER — DOCUSATE SODIUM 100 MG PO CAPS
100.0000 mg | ORAL_CAPSULE | Freq: Two times a day (BID) | ORAL | Status: DC
  Administered 2019-08-26 – 2019-08-27 (×3): 100 mg via ORAL
  Filled 2019-08-26 (×4): qty 1

## 2019-08-26 NOTE — Progress Notes (Signed)
PROGRESS NOTE    Rachel Le  MCN:[id number] DOB: Apr 28, 1970 DOA: 08/25/2019 PCP: Sandre Kitty, PA-C   Brief Narrative: Rachel Le is a 49 y.o. female with a history of hypertension. She presented secondary to RUQ and epigastric pain with concern hepatitis vs gallbladder disease. General surgery and GI consulted. Patient started empirically on Zosyn IV.   Assessment & Plan:   Principal Problem:   Acute hepatitis B virus infection Active Problems:   Transaminitis   Essential hypertension   RUQ pain   Acute hepatitis B infection Transaminitis AST/ALT of 1553/2720 on admission and have been downtrending. Associated elevated alkaline phosphatase of 201 and downtrending. Bilirubin of 5.8 and slightly trended up. Patient empirically started on Zosyn; GI and general surgery consulted. Hepatitis B ag positive so no surgery planned. Supportive care only. Antibiotics discontinued. -Advance diet as tolerated -AM Hepatic function panel  RUQ pain Secondary to above. Improved.  Essential hypertension Patient is on lisinopril-hydrochlorothiazide as an outpatient. BP slightly low on admission. Antihypertensives held.   DVT prophylaxis: SCDs Code Status:   Code Status: Full Code Family Communication: None at bedside Disposition Plan: Discharge home likely in 24 hours depending on AST/ALT trend   Consultants:   General surgery  Gastroenterology  Procedures:   None  Antimicrobials:  Zosyn IV    Subjective: RUQ pain is better.  Objective: Vitals:   08/26/19 0002 08/26/19 0034 08/26/19 0516 08/26/19 1016  BP: (!) 106/50 118/62 (!) 101/59 131/64  Pulse: 71 69 68 83  Resp: 16 17 16    Temp: 98.9 F (37.2 C) 98.4 F (36.9 C) 98.3 F (36.8 C) 98.1 F (36.7 C)  TempSrc: Oral Oral Oral Oral  SpO2: 98% 99% 99% 100%    Intake/Output Summary (Last 24 hours) at 08/26/2019 1057 Last data filed at 08/26/2019 0313 Gross per 24 hour    Intake 1359.02 ml  Output --  Net 1359.02 ml   There were no vitals filed for this visit.  Examination:  General exam: Appears calm and comfortable Respiratory system: Clear to auscultation. Respiratory effort normal. Cardiovascular system: S1 & S2 heard, RRR. No murmurs, rubs, gallops or clicks. Gastrointestinal system: Abdomen is nondistended, soft and nontender. No organomegaly or masses felt. Normal bowel sounds heard. Central nervous system: Alert and oriented. No focal neurological deficits. Musculoskeletal: No edema. No calf tenderness Skin: No cyanosis. No rashes Psychiatry: Judgement and insight appear normal. Mood & affect appropriate.     Data Reviewed: I have personally reviewed following labs and imaging studies  CBC Lab Results  Component Value Date   WBC 4.8 08/26/2019   RBC 4.43 08/26/2019   HGB 13.4 08/26/2019   HCT 40.9 08/26/2019   MCV 92.3 08/26/2019   MCH 30.2 08/26/2019   PLT 231 08/26/2019   MCHC 32.8 08/26/2019   RDW 14.1 08/26/2019   LYMPHSABS 0.9 08/26/2019   MONOABS 0.6 08/26/2019   EOSABS 0.1 08/26/2019   BASOSABS 0.0 08/26/2019     Last metabolic panel Lab Results  Component Value Date   NA 139 08/26/2019   K 3.9 08/26/2019   CL 104 08/26/2019   CO2 25 08/26/2019   BUN <5 (L) 08/26/2019   CREATININE 0.41 (L) 08/26/2019   GLUCOSE 98 08/26/2019   GFRNONAA >60 08/26/2019   GFRAA >60 08/26/2019   CALCIUM 8.3 (L) 08/26/2019   PHOS 3.3 08/26/2019   PROT 6.6 08/26/2019   ALBUMIN 3.3 (L) 08/26/2019   BILITOT 6.5 (H) 08/26/2019   ALKPHOS 188 (  H) 08/26/2019   AST 1,521 (H) 08/26/2019   ALT PENDING 08/26/2019   ANIONGAP 10 08/26/2019    CBG (last 3)  No results for input(s): GLUCAP in the last 72 hours.   GFR: CrCl cannot be calculated (Unknown ideal weight.).  Coagulation Profile: Recent Labs  Lab 08/25/19 2057 08/26/19 0845  INR 1.1 1.1    Recent Results (from the past 240 hour(s))  SARS Coronavirus 2 by RT PCR  (hospital order, performed in [facility] hospital lab) Nasopharyngeal Nasopharyngeal Swab     Status: None   Collection Time: 08/25/19  5:33 PM   Specimen: Nasopharyngeal Swab  Result Value Ref Range Status   SARS Coronavirus 2 NEGATIVE NEGATIVE Final    Comment: (NOTE) SARS-CoV-2 target nucleic acids are NOT DETECTED.  The SARS-CoV-2 RNA is generally detectable in upper and lower respiratory specimens during the acute phase of infection. The lowest concentration of SARS-CoV-2 viral copies this assay can detect is 250 copies / mL. A negative result does not preclude SARS-CoV-2 infection and should not be used as the sole basis for treatment or other patient management decisions.  A negative result may occur with improper specimen collection / handling, submission of specimen other than nasopharyngeal swab, presence of viral mutation(s) within the areas targeted by this assay, and inadequate number of viral copies (<250 copies / mL). A negative result must be combined with clinical observations, patient history, and epidemiological information.  Fact Sheet for Patients:   BoilerBrush.com.cy  Fact Sheet for Healthcare Providers: https://pope.com/  This test is not yet approved or  cleared by the [place] FDA and has been authorized for detection and/or diagnosis of SARS-CoV-2 by FDA under an Emergency Use Authorization (EUA).  This EUA will remain in effect (meaning this test can be used) for the duration of the COVID-19 declaration under Section 564(b)(1) of the Act, 21 U.S.C. section 360bbb-3(b)(1), unless the authorization is terminated or revoked sooner.  Performed at [facility], [address], [place], [place] [address]         Radiology Studies: CT ABDOMEN PELVIS W CONTRAST  Result Date: 08/25/2019 CLINICAL DATA:  Epigastric pain. EXAM: CT ABDOMEN AND PELVIS WITH CONTRAST TECHNIQUE: Multidetector CT imaging  of the abdomen and pelvis was performed using the standard protocol following bolus administration of intravenous contrast. CONTRAST:  OMNIPAQUE IOHEXOL 300 MG/ML  SOLN COMPARISON:  None. FINDINGS: Lower chest: The lung bases are clear. The heart size is normal. Hepatobiliary: The liver is normal. There is cholelithiasis.There is suggestion of mild gallbladder wall thickening and possible trace pericholecystic free fluid. Pancreas: Normal contours without ductal dilatation. No peripancreatic fluid collection. Spleen: Unremarkable. Adrenals/Urinary Tract: --Adrenal glands: Unremarkable. --Right kidney/ureter: No hydronephrosis or radiopaque kidney stones. --Left kidney/ureter: No hydronephrosis or radiopaque kidney stones. --Urinary bladder: Unremarkable. Stomach/Bowel: --Stomach/Duodenum: No hiatal hernia or other gastric abnormality. Normal duodenal course and caliber. --Small bowel: Unremarkable. --Colon: There is a moderate amount of stool in the colon. --Appendix: Normal. Vascular/Lymphatic: Normal course and caliber of the major abdominal vessels. --No retroperitoneal lymphadenopathy. --No mesenteric lymphadenopathy. --No pelvic or inguinal lymphadenopathy. Reproductive: Again noted are findings suspicious for a septate or arcuate uterus. Other: No ascites or free air. The abdominal wall is normal. Musculoskeletal. There is atrophy of the lower extremity musculature, right worse than left. IMPRESSION: 1. Cholelithiasis with questionable wall thickening and pericholecystic free fluid. If there is high clinical suspicion for acute cholecystitis, follow-up with HIDA scan is recommended. 2. No additional acute abnormality  detected. Chronic findings as detailed above. Electronically Signed   By: Katherine Mantle M.D.   On: 08/25/2019 20:14   US Abdomen Limited RUQ  Result Date: 08/25/2019 CLINICAL DATA:  Right upper quadrant pain. EXAM: ULTRASOUND ABDOMEN LIMITED RIGHT UPPER QUADRANT COMPARISON:  None  FINDINGS: Gallbladder: Intraluminal shadowing calculus measuring 1.9 cm. No wall thickening visualized. No pericholecystic free fluid. No sonographic Murphy sign noted by sonographer. Common bile duct: Diameter: 4.5 mm Liver: No focal lesion identified. Within normal limits in parenchymal echogenicity. Portal vein is patent on color Doppler imaging with normal direction of blood flow towards the liver. Other: None. IMPRESSION: Cholelithiasis without sonographic evidence of cholecystitis. Electronically Signed   By: Stana Bunting M.D.   On: 08/25/2019 16:44        Scheduled Meds: . docusate sodium  100 mg Oral BID  . sodium chloride flush  3 mL Intravenous Once  . sodium chloride flush  3 mL Intravenous Q12H   Continuous Infusions: . piperacillin-tazobactam (ZOSYN)  IV 3.375 g (08/26/19 0606)     LOS: 0 days     Jacquelin Hawking, MD [facility] 08/26/2019, 10:57 AM  If 7PM-7AM, please contact night-coverage [email or web address]

## 2019-08-26 NOTE — Progress Notes (Signed)
Hep B Core IgM reactive consistent with viral hepatitis. No plans for surgery or further gallbladder work up given this finding

## 2019-08-26 NOTE — Consult Note (Signed)
Referring Provider:  Primary Care Physician:  Marya Landry Primary Gastroenterologist:  Gentry Fitz  Reason for Consultation:  Acute hepatitis  HPI: Rachel Le is a 49 y.o. female with no pertinent past medical history presenting with acute Hepatitis B infection.  Patient reports fatigue for the past week, as well as epigastric pain for the past 4 to 5 days.  She has had some nausea, mostly with eating, as well as a few episodes of nonbloody nonbilious emesis.  She is also had a decreased appetite and early satiety over the last week.  Also notes dark urine.  She denies any GERD, dysphagia, unexplained weight loss, changes in stool, melena, or hematochezia.    Denies any IV drug use.  Denies any history of blood transfusion.  Denies any medications.  Reports a new sexual partner over the last 2 years, though she states that she does not often use protection.  Denies family history of liver disease, colon cancer, or gastrointestinal malignancy.  Has never had an EGD or colonoscopy.  Past Medical History:  Diagnosis Date  . Hypertension     Past Surgical History:  Procedure Laterality Date  . bilateral knee surgery    . HIP FRACTURE SURGERY      Prior to Admission medications   Medication Sig Start Date End Date Taking? Authorizing Provider  lisinopril-hydrochlorothiazide (PRINZIDE,ZESTORETIC) 10-12.5 MG tablet Take 1 tablet by mouth daily.   Yes [provider]  omeprazole (PRILOSEC) 20 MG capsule Take 1 capsule (20 mg total) by mouth daily. 05/17/17  Yes Wallis Bamberg, PA-C  ranitidine (ZANTAC) 150 MG tablet Take 1 tablet (150 mg total) by mouth 2 (two) times daily. Patient not taking: Reported on 04/08/2018 05/17/17   Wallis Bamberg, PA-C    Scheduled Meds: . docusate sodium  100 mg Oral BID  . sodium chloride flush  3 mL Intravenous Once  . sodium chloride flush  3 mL Intravenous Q12H   Continuous Infusions: . sodium chloride 125 mL/hr at  08/26/19 0028  . piperacillin-tazobactam (ZOSYN)  IV 3.375 g (08/26/19 0606)   PRN Meds:.acetaminophen **OR** acetaminophen, HYDROcodone-acetaminophen, ondansetron **OR** ondansetron (ZOFRAN) IV  Allergies as of 08/25/2019  . (No Known Allergies)    Family History  Problem Relation Age of Onset  . Hypertension Mother   . Breast cancer Neg Hx     Social History   Socioeconomic History  . Marital status: Married    Spouse name: Not on file  . Number of children: 0  . Years of education: Not on file  . Highest education level: High school graduate  Occupational History  . Not on file  Tobacco Use  . Smoking status: Never Smoker  . Smokeless tobacco: Never Used  Vaping Use  . Vaping Use: Never used  Substance and Sexual Activity  . Alcohol use: Yes    Comment: occassionally  . Drug use: No  . Sexual activity: Yes    Birth control/protection: Condom  Other Topics Concern  . Not on file  Social History Narrative  . Not on file   Social Determinants of Health   Financial Resource Strain:   . Difficulty of Paying Living Expenses:   Food Insecurity:   . Worried About Programme researcher, broadcasting/film/video in the Last Year:   . Barista in the Last Year:   Transportation Needs: No Transportation Needs  . Lack of Transportation (Medical): No  . Lack of Transportation (Non-Medical): No  Physical Activity:   .  Days of Exercise per Week:   . Minutes of Exercise per Session:   Stress:   . Feeling of Stress :   Social Connections:   . Frequency of Communication with Friends and Family:   . Frequency of Social Gatherings with Friends and Family:   . Attends Religious Services:   . Active Member of Clubs or Organizations:   . Attends Banker Meetings:   Marland Kitchen Marital Status:   Intimate Partner Violence:   . Fear of Current or Ex-Partner:   . Emotionally Abused:   Marland Kitchen Physically Abused:   . Sexually Abused:     Review of Systems: Review of Systems  Constitutional:  Positive for malaise/fatigue. Negative for chills, fever and weight loss.  HENT: Negative for hearing loss and tinnitus.   Eyes: Negative for pain and redness.  Respiratory: Negative for cough and shortness of breath.   Cardiovascular: Negative for chest pain and palpitations.  Gastrointestinal: Positive for abdominal pain, nausea and vomiting. Negative for blood in stool, constipation, diarrhea, heartburn and melena.  Genitourinary: Negative for flank pain and hematuria.  Musculoskeletal: Negative for falls and joint pain.  Neurological: Negative for seizures and loss of consciousness.  Endo/Heme/Allergies: Negative for polydipsia. Does not bruise/bleed easily.  Psychiatric/Behavioral: Negative for substance abuse. The patient is not nervous/anxious.     Physical Exam: Vital signs: Vitals:   08/26/19 0034 08/26/19 0516  BP: 118/62 (!) 101/59  Pulse: 69 68  Resp: 17 16  Temp: 98.4 F (36.9 C) 98.3 F (36.8 C)  SpO2: 99% 99%     Physical Exam Constitutional:      General: She is not in acute distress.    Appearance: She is well-developed.  HENT:     Head: Normocephalic and atraumatic.     Nose: Nose normal.     Mouth/Throat:     Mouth: Mucous membranes are moist.     Pharynx: Oropharynx is clear.  Eyes:     General: Scleral icterus present.     Extraocular Movements: Extraocular movements intact.  Cardiovascular:     Rate and Rhythm: Normal rate and regular rhythm.     Pulses: Normal pulses.     Heart sounds: Normal heart sounds.  Pulmonary:     Effort: Pulmonary effort is normal. No respiratory distress.     Breath sounds: Normal breath sounds.  Abdominal:     General: Abdomen is flat. Bowel sounds are normal. There is no distension.     Palpations: Abdomen is soft. There is no mass.     Tenderness: There is abdominal tenderness (epigastric). There is no guarding or rebound.     Hernia: No hernia is present.  Musculoskeletal:        General: No swelling or  tenderness.     Cervical back: Normal range of motion and neck supple.  Skin:    General: Skin is warm and dry.     Coloration: Skin is jaundiced.  Neurological:     General: No focal deficit present.     Mental Status: She is oriented to person, place, and time.  Psychiatric:        Mood and Affect: Mood normal.        Behavior: Behavior normal.    GI:  Lab Results: Recent Labs    08/25/19 1057  WBC 5.7  HGB 13.8  HCT 42.0  PLT 233   BMET Recent Labs    08/25/19 1057 08/25/19 2057  NA 135 136  K 4.2  3.7  CL 100 105  CO2 29 23  GLUCOSE 110* 167*  BUN 9 <5*  CREATININE 0.44 0.38*  CALCIUM 8.7* 7.8*   LFT Recent Labs    08/25/19 2057  PROT 5.7*  ALBUMIN 3.0*  AST 1,258*  ALT 2,230*  ALKPHOS 190*  BILITOT 5.3*  5.3*  BILIDIR 3.2*  IBILI 2.1*   PT/INR Recent Labs    08/25/19 2057  LABPROT 14.0  INR 1.1     Studies/Results: CT ABDOMEN PELVIS W CONTRAST  Result Date: 08/25/2019 CLINICAL DATA:  Epigastric pain. EXAM: CT ABDOMEN AND PELVIS WITH CONTRAST TECHNIQUE: Multidetector CT imaging of the abdomen and pelvis was performed using the standard protocol following bolus administration of intravenous contrast. CONTRAST:  OMNIPAQUE IOHEXOL 300 MG/ML  SOLN COMPARISON:  None. FINDINGS: Lower chest: The lung bases are clear. The heart size is normal. Hepatobiliary: The liver is normal. There is cholelithiasis.There is suggestion of mild gallbladder wall thickening and possible trace pericholecystic free fluid. Pancreas: Normal contours without ductal dilatation. No peripancreatic fluid collection. Spleen: Unremarkable. Adrenals/Urinary Tract: --Adrenal glands: Unremarkable. --Right kidney/ureter: No hydronephrosis or radiopaque kidney stones. --Left kidney/ureter: No hydronephrosis or radiopaque kidney stones. --Urinary bladder: Unremarkable. Stomach/Bowel: --Stomach/Duodenum: No hiatal hernia or other gastric abnormality. Normal duodenal course and caliber.  --Small bowel: Unremarkable. --Colon: There is a moderate amount of stool in the colon. --Appendix: Normal. Vascular/Lymphatic: Normal course and caliber of the major abdominal vessels. --No retroperitoneal lymphadenopathy. --No mesenteric lymphadenopathy. --No pelvic or inguinal lymphadenopathy. Reproductive: Again noted are findings suspicious for a septate or arcuate uterus. Other: No ascites or free air. The abdominal wall is normal. Musculoskeletal. There is atrophy of the lower extremity musculature, right worse than left. IMPRESSION: 1. Cholelithiasis with questionable wall thickening and pericholecystic free fluid. If there is high clinical suspicion for acute cholecystitis, follow-up with HIDA scan is recommended. 2. No additional acute abnormality detected. Chronic findings as detailed above. Electronically Signed   By: Katherine Mantle M.D.   On: 08/25/2019 20:14   US Abdomen Limited RUQ  Result Date: 08/25/2019 CLINICAL DATA:  Right upper quadrant pain. EXAM: ULTRASOUND ABDOMEN LIMITED RIGHT UPPER QUADRANT COMPARISON:  None FINDINGS: Gallbladder: Intraluminal shadowing calculus measuring 1.9 cm. No wall thickening visualized. No pericholecystic free fluid. No sonographic Murphy sign noted by sonographer. Common bile duct: Diameter: 4.5 mm Liver: No focal lesion identified. Within normal limits in parenchymal echogenicity. Portal vein is patent on color Doppler imaging with normal direction of blood flow towards the liver. Other: None. IMPRESSION: Cholelithiasis without sonographic evidence of cholecystitis. Electronically Signed   By: Stana Bunting M.D.   On: 08/25/2019 16:44    Impression: Acute Hepatitis B infection: acute IgM positive 8/5, quantitative pending -Transaminases continue to increase.  T bili 6.5/AST 1521/ALT pending/ALP 188 today.  Yesterday, T bili 5.3/AST 1258/ALT 2230/ALP 190 -No coagulopathy, INR 1.1  Plan: Continue supportive care.  Soft diet OK.  Continue to  trend LFTs.  Discussed with patient the importance of avoidance of alcohol and hepatotoxic agents.  Additionally, discussed likely etiology of hepatitis B infection (sexual intercourse).  Discussed that most acute hepatitis B infections resolved without therapy.  [facility] will sign off.  Please contact us if we can be of any further assistance during his hospital stay.   LOS: 0 days   Edrick Kins  PA-C 08/26/2019, 8:44 AM  Contact #  [phone]

## 2019-08-27 LAB — HEPATITIS PANEL, ACUTE
HCV Ab: NONREACTIVE
Hep A IgM: NONREACTIVE
Hep B C IgM: REACTIVE — AB
Hepatitis B Surface Ag: REACTIVE — AB

## 2019-08-27 LAB — HBV REAL-TIME PCR, QUANT
HBV AS IU/ML: 13100000 IU/mL
LOG10 HBV AS IU/ML: 7.117 log10 IU/mL

## 2019-08-27 LAB — HEPATITIS B DNA, ULTRAQUANTITATIVE, PCR

## 2019-08-27 LAB — HEPATIC FUNCTION PANEL
ALT: 2324 U/L — ABNORMAL HIGH (ref 0–44)
AST: 1513 U/L — ABNORMAL HIGH (ref 15–41)
Albumin: 3 g/dL — ABNORMAL LOW (ref 3.5–5.0)
Alkaline Phosphatase: 168 U/L — ABNORMAL HIGH (ref 38–126)
Bilirubin, Direct: 4.3 mg/dL — ABNORMAL HIGH (ref 0.0–0.2)
Indirect Bilirubin: 2.7 mg/dL — ABNORMAL HIGH (ref 0.3–0.9)
Total Bilirubin: 7 mg/dL — ABNORMAL HIGH (ref 0.3–1.2)
Total Protein: 6.2 g/dL — ABNORMAL LOW (ref 6.5–8.1)

## 2019-08-27 LAB — HCV RNA QUANT: HCV Quantitative: NOT DETECTED IU/mL (ref >50)

## 2019-08-27 NOTE — Discharge Summary (Signed)
Physician Discharge Summary  MA. Shailah Gibbins SWF:[id number] DOB: 1971/01/07 DOA: 08/25/2019  PCP: Sandre Kitty, PA-C  Admit date: 08/25/2019 Discharge date: 08/27/2019  Admitted From: Home Disposition: Home  Recommendations for Outpatient Follow-up:  1. Follow up with PCP in 1 week 2. Follow up with GI for repeat hepatic function panel 3. Please follow up on the following pending results: None  Home Health: None Equipment/Devices: None  Discharge Condition: Stable CODE STATUS: Full code Diet recommendation: Regular   Brief/Interim Summary:  Admission HPI written by Therisa Doyne, MD   HPI: Rachel Le is a 49 y.o. female with medical history significant of HTN     Presented with   4-5 days of RUQ /epigastric  Pain radiating to her back worse when she eats especially if she is eating fatty foods associated with nausea and some vomiting no diarrhea.  No fever no chills.  Started when she ate a steak few days ago.  Since then eating anything fatty makes her chronically bloated and nauseous. Patient states she only drinks couple of glasses of wine in the weekend.  She does not smoke. Does not use any herbal supplements.  Uses Tylenol only to control her pain when she has it.  Which is just few times per month.    Hospital course:  Acute hepatitis B infection Transaminitis AST/ALT of 1553/2720 on admission and have been downtrending. Associated elevated alkaline phosphatase of 201 and downtrending. Bilirubin of 5.8 and slightly trended up. Patient empirically started on Zosyn; GI and general surgery consulted. Hepatitis B surface ag and core antibody positive. Supportive care only. Antibiotics discontinued. Diet advanced successfully. LFTs stable with slightly rising bilirubin. Discussed with GI and patient is stable for discharge home with outpatient follow-up.  RUQ pain Secondary to above. Resolved.  Essential  hypertension Patient is on lisinopril-hydrochlorothiazide as an outpatient. BP slightly low on admission. Restart on discharge.  Discharge Diagnoses:  Principal Problem:   Acute hepatitis B virus infection Active Problems:   Transaminitis   Essential hypertension   RUQ pain    Discharge Instructions  Discharge Instructions    Call MD for:  persistant nausea and vomiting   Complete by: As directed    Call MD for:  severe uncontrolled pain   Complete by: As directed    Call MD for:  temperature >100.4   Complete by: As directed    Increase activity slowly   Complete by: As directed      Allergies as of 08/27/2019   No Known Allergies     Medication List    STOP taking these medications   ranitidine 150 MG tablet Commonly known as: ZANTAC     TAKE these medications   lisinopril-hydrochlorothiazide 10-12.5 MG tablet Commonly known as: ZESTORETIC Take 1 tablet by mouth daily.   omeprazole 20 MG capsule Commonly known as: PRILOSEC Take 1 capsule (20 mg total) by mouth daily.       Follow-up Information    Sandre Kitty, PA-C. Schedule an appointment as soon as possible for a visit in 1 week(s).   Specialty: Physician Assistant Why: Hospital follow-up Contact information: [address] [place] [address] [phone]        Charlott Rakes, MD. Schedule an appointment as soon as possible for a visit in 1 week(s).   Specialty: Gastroenterology Why: Hepatitis B follow-up. Repeat labwork Contact information: [address] [place] [address] [phone]  No Known Allergies  Consultations:  General surgery  [facility]   Procedures/Studies: CT ABDOMEN PELVIS W CONTRAST  Result Date: 08/25/2019 CLINICAL DATA:  Epigastric pain. EXAM: CT ABDOMEN AND PELVIS WITH CONTRAST TECHNIQUE: Multidetector CT imaging of the abdomen and pelvis was performed using the standard protocol following bolus  administration of intravenous contrast. CONTRAST:  OMNIPAQUE IOHEXOL 300 MG/ML  SOLN COMPARISON:  None. FINDINGS: Lower chest: The lung bases are clear. The heart size is normal. Hepatobiliary: The liver is normal. There is cholelithiasis.There is suggestion of mild gallbladder wall thickening and possible trace pericholecystic free fluid. Pancreas: Normal contours without ductal dilatation. No peripancreatic fluid collection. Spleen: Unremarkable. Adrenals/Urinary Tract: --Adrenal glands: Unremarkable. --Right kidney/ureter: No hydronephrosis or radiopaque kidney stones. --Left kidney/ureter: No hydronephrosis or radiopaque kidney stones. --Urinary bladder: Unremarkable. Stomach/Bowel: --Stomach/Duodenum: No hiatal hernia or other gastric abnormality. Normal duodenal course and caliber. --Small bowel: Unremarkable. --Colon: There is a moderate amount of stool in the colon. --Appendix: Normal. Vascular/Lymphatic: Normal course and caliber of the major abdominal vessels. --No retroperitoneal lymphadenopathy. --No mesenteric lymphadenopathy. --No pelvic or inguinal lymphadenopathy. Reproductive: Again noted are findings suspicious for a septate or arcuate uterus. Other: No ascites or free air. The abdominal wall is normal. Musculoskeletal. There is atrophy of the lower extremity musculature, right worse than left. IMPRESSION: 1. Cholelithiasis with questionable wall thickening and pericholecystic free fluid. If there is high clinical suspicion for acute cholecystitis, follow-up with HIDA scan is recommended. 2. No additional acute abnormality detected. Chronic findings as detailed above. Electronically Signed   By: Katherine Mantle M.D.   On: 08/25/2019 20:14   US Abdomen Limited RUQ  Result Date: 08/25/2019 CLINICAL DATA:  Right upper quadrant pain. EXAM: ULTRASOUND ABDOMEN LIMITED RIGHT UPPER QUADRANT COMPARISON:  None FINDINGS: Gallbladder: Intraluminal shadowing calculus measuring 1.9 cm. No wall  thickening visualized. No pericholecystic free fluid. No sonographic Murphy sign noted by sonographer. Common bile duct: Diameter: 4.5 mm Liver: No focal lesion identified. Within normal limits in parenchymal echogenicity. Portal vein is patent on color Doppler imaging with normal direction of blood flow towards the liver. Other: None. IMPRESSION: Cholelithiasis without sonographic evidence of cholecystitis. Electronically Signed   By: Stana Bunting M.D.   On: 08/25/2019 16:44      Subjective: Feels weak, otherwise, feels fine  Discharge Exam: Vitals:   08/26/19 2113 08/27/19 0539  BP: (!) 114/56 111/67  Pulse: 73 81  Resp: 16 18  Temp: 99.1 F (37.3 C) 98 F (36.7 C)  SpO2: 100% 96%   Vitals:   08/26/19 1016 08/26/19 1341 08/26/19 2113 08/27/19 0539  BP: 131/64 123/67 (!) 114/56 111/67  Pulse: 83 72 73 81  Resp:   16 18  Temp: 98.1 F (36.7 C) 98.2 F (36.8 C) 99.1 F (37.3 C) 98 F (36.7 C)  TempSrc: Oral Oral Oral   SpO2: 100% 99% 100% 96%    General: Pt is alert, awake, not in acute distress Cardiovascular: RRR, S1/S2 +, no rubs, no gallops Respiratory: CTA bilaterally, no wheezing, no rhonchi Abdominal: Soft, NT, ND, bowel sounds + Extremities: no edema, no cyanosis    The results of significant diagnostics from this hospitalization (including imaging, microbiology, ancillary and laboratory) are listed below for reference.     Microbiology: Recent Results (from the past 240 hour(s))  SARS Coronavirus 2 by RT PCR (hospital order, performed in [facility] hospital lab) Nasopharyngeal Nasopharyngeal Swab     Status: None   Collection Time: 08/25/19  5:33 PM  Specimen: Nasopharyngeal Swab  Result Value Ref Range Status   SARS Coronavirus 2 NEGATIVE NEGATIVE Final    Comment: (NOTE) SARS-CoV-2 target nucleic acids are NOT DETECTED.  The SARS-CoV-2 RNA is generally detectable in upper and lower respiratory specimens during the acute phase of infection. The  lowest concentration of SARS-CoV-2 viral copies this assay can detect is 250 copies / mL. A negative result does not preclude SARS-CoV-2 infection and should not be used as the sole basis for treatment or other patient management decisions.  A negative result may occur with improper specimen collection / handling, submission of specimen other than nasopharyngeal swab, presence of viral mutation(s) within the areas targeted by this assay, and inadequate number of viral copies (<250 copies / mL). A negative result must be combined with clinical observations, patient history, and epidemiological information.  Fact Sheet for Patients:   BoilerBrush.com.cyhttps://www.fda.gov/media/136312/download  Fact Sheet for Healthcare Providers: https://pope.com/https://www.fda.gov/media/136313/download  This test is not yet approved or  cleared by the [place] FDA and has been authorized for detection and/or diagnosis of SARS-CoV-2 by FDA under an Emergency Use Authorization (EUA).  This EUA will remain in effect (meaning this test can be used) for the duration of the COVID-19 declaration under Section 564(b)(1) of the Act, 21 U.S.C. section 360bbb-3(b)(1), unless the authorization is terminated or revoked sooner.  Performed at [facility], [address] [place], [place] [address]      Labs: BNP (last 3 results) No results for input(s): BNP in the last 8760 hours. Basic Metabolic Panel: Recent Labs  Lab 08/25/19 1057 08/25/19 2057 08/26/19 0845  NA 135 136 139  K 4.2 3.7 3.9  CL 100 105 104  CO2 29 23 25   GLUCOSE 110* 167* 98  BUN 9 <5* <5*  CREATININE 0.44 0.38* 0.41*  CALCIUM 8.7* 7.8* 8.3*  MG  --  2.1 2.3  PHOS  --  2.9 3.3   Liver Function Tests: Recent Labs  Lab 08/25/19 1057 08/25/19 2057 08/26/19 0845 08/27/19 0306  AST 1,553* 1,258* 1,521* 1,513*  ALT 2,720* 2,230* 2,513* 2,324*  ALKPHOS 201* 190* 188* 168*  BILITOT 5.8* 5.3*  5.3* 6.5* 7.0*  PROT 6.8 5.7* 6.6 6.2*  ALBUMIN 3.5  3.0* 3.3* 3.0*   Recent Labs  Lab 08/25/19 1057  LIPASE 36   No results for input(s): AMMONIA in the last 168 hours. CBC: Recent Labs  Lab 08/25/19 1057 08/26/19 0845  WBC 5.7 4.8  NEUTROABS  --  3.1  HGB 13.8 13.4  HCT 42.0 40.9  MCV 92.5 92.3  PLT 233 231   Cardiac Enzymes: Recent Labs  Lab 08/25/19 2057  CKTOTAL 63   BNP: Invalid input(s): POCBNP CBG: No results for input(s): GLUCAP in the last 168 hours. D-Dimer No results for input(s): DDIMER in the last 72 hours. Hgb A1c Recent Labs    08/26/19 0845  HGBA1C 5.2   Lipid Profile Recent Labs    08/26/19 0845  CHOL 137  HDL 15*  LDLCALC 85  TRIG 604185*  CHOLHDL 9.1   Thyroid function studies Recent Labs    08/26/19 0845  TSH 2.280   Anemia work up No results for input(s): VITAMINB12, FOLATE, FERRITIN, TIBC, IRON, RETICCTPCT in the last 72 hours. Urinalysis    Component Value Date/Time   COLORURINE AMBER (A) 08/25/2019 1833   APPEARANCEUR HAZY (A) 08/25/2019 1833   LABSPEC 1.010 08/25/2019 1833   PHURINE 7.0 08/25/2019 1833   GLUCOSEU NEGATIVE 08/25/2019 1833   HGBUR  NEGATIVE 08/25/2019 1833   BILIRUBINUR NEGATIVE 08/25/2019 1833   KETONESUR NEGATIVE 08/25/2019 1833   PROTEINUR NEGATIVE 08/25/2019 1833   NITRITE NEGATIVE 08/25/2019 1833   LEUKOCYTESUR NEGATIVE 08/25/2019 1833   Sepsis Labs Invalid input(s): PROCALCITONIN,  WBC,  LACTICIDVEN Microbiology Recent Results (from the past 240 hour(s))  SARS Coronavirus 2 by RT PCR (hospital order, performed in [facility] hospital lab) Nasopharyngeal Nasopharyngeal Swab     Status: None   Collection Time: 08/25/19  5:33 PM   Specimen: Nasopharyngeal Swab  Result Value Ref Range Status   SARS Coronavirus 2 NEGATIVE NEGATIVE Final    Comment: (NOTE) SARS-CoV-2 target nucleic acids are NOT DETECTED.  The SARS-CoV-2 RNA is generally detectable in upper and lower respiratory specimens during the acute phase of infection. The  lowest concentration of SARS-CoV-2 viral copies this assay can detect is 250 copies / mL. A negative result does not preclude SARS-CoV-2 infection and should not be used as the sole basis for treatment or other patient management decisions.  A negative result may occur with improper specimen collection / handling, submission of specimen other than nasopharyngeal swab, presence of viral mutation(s) within the areas targeted by this assay, and inadequate number of viral copies (<250 copies / mL). A negative result must be combined with clinical observations, patient history, and epidemiological information.  Fact Sheet for Patients:   BoilerBrush.com.cy  Fact Sheet for Healthcare Providers: https://pope.com/  This test is not yet approved or  cleared by the [place] FDA and has been authorized for detection and/or diagnosis of SARS-CoV-2 by FDA under an Emergency Use Authorization (EUA).  This EUA will remain in effect (meaning this test can be used) for the duration of the COVID-19 declaration under Section 564(b)(1) of the Act, 21 U.S.C. section 360bbb-3(b)(1), unless the authorization is terminated or revoked sooner.  Performed at [facility], [address] [place], [place] [address]      SIGNED:   Jacquelin Hawking, MD [facility] 08/27/2019, 10:01 AM

## 2019-08-27 NOTE — Progress Notes (Signed)
Discharge instructions reviewed at bedside with patient, spouse and interpreter services.  Both verbalized understanding and declined further education.  Patient transported off unit via wheelchair and staff supervision for discharge.

## 2019-08-27 NOTE — Discharge Instructions (Signed)
Rachel Le,  You were in the hospital because of hepatitis B. Your current acute infection process will improve on its own but if you feel worse, please return for reevaluation. Your partner needs to be tested. Please limit your Tylenol use to no more than [phone] mg per day. Please follow-up with the GI doctor to have your labwork repeated.

## 2019-09-01 ENCOUNTER — Encounter (HOSPITAL_COMMUNITY): Payer: Self-pay | Admitting: *Deleted

## 2019-09-01 ENCOUNTER — Emergency Department (HOSPITAL_COMMUNITY)
Admission: EM | Admit: 2019-09-01 | Discharge: 2019-09-02 | Disposition: A | Discharge status: Home / Self Care | Payer: No Typology Code available for payment source | Attending: Emergency Medicine | Admitting: Emergency Medicine

## 2019-09-01 ENCOUNTER — Other Ambulatory Visit: Payer: Self-pay

## 2019-09-01 DIAGNOSIS — Z79899 Other long term (current) drug therapy: Secondary | ICD-10-CM | POA: Insufficient documentation

## 2019-09-01 DIAGNOSIS — B169 Acute hepatitis B without delta-agent and without hepatic coma: Secondary | ICD-10-CM | POA: Insufficient documentation

## 2019-09-01 LAB — COMPREHENSIVE METABOLIC PANEL
ALT: 3165 U/L — ABNORMAL HIGH (ref 0–44)
AST: 2252 U/L — ABNORMAL HIGH (ref 15–41)
Albumin: 3.3 g/dL — ABNORMAL LOW (ref 3.5–5.0)
Alkaline Phosphatase: 180 U/L — ABNORMAL HIGH (ref 38–126)
Anion gap: 9 (ref 5–15)
BUN: 7 mg/dL (ref 6–20)
CO2: 24 mmol/L (ref 22–32)
Calcium: 8.9 mg/dL (ref 8.9–10.3)
Chloride: 103 mmol/L (ref 98–111)
Creatinine, Ser: 0.36 mg/dL — ABNORMAL LOW (ref 0.44–1.00)
GFR calc Af Amer: >60 mL/min (ref >60)
GFR calc non Af Amer: >60 mL/min (ref >60)
Glucose, Bld: 92 mg/dL (ref 70–99)
Potassium: 4.4 mmol/L (ref 3.5–5.1)
Sodium: 136 mmol/L (ref 135–145)
Total Bilirubin: 12.9 mg/dL — ABNORMAL HIGH (ref 0.3–1.2)
Total Protein: 7.3 g/dL (ref 6.5–8.1)

## 2019-09-01 LAB — CBC
HCT: 39.3 % (ref 36.0–46.0)
Hemoglobin: 12.9 g/dL (ref 12.0–15.0)
MCH: 29.9 pg (ref 26.0–34.0)
MCHC: 32.8 g/dL (ref 30.0–36.0)
MCV: 91.2 fL (ref 80.0–100.0)
Platelets: 241 10*3/uL (ref 150–400)
RBC: 4.31 MIL/uL (ref 3.87–5.11)
RDW: 15.8 % — ABNORMAL HIGH (ref 11.5–15.5)
WBC: 6.5 10*3/uL (ref 4.0–10.5)
nRBC: 0 % (ref 0.0–0.2)

## 2019-09-01 LAB — I-STAT BETA HCG BLOOD, ED (MC, WL, AP ONLY): I-stat hCG, quantitative: <5 m[IU]/mL (ref <5)

## 2019-09-01 LAB — LIPASE, BLOOD: Lipase: 42 U/L (ref 11–51)

## 2019-09-01 NOTE — ED Triage Notes (Signed)
Pt arrived POV from home after having labs done showing elevated liver enzymes. Pt reports being admitted and discharged and told to return if not feeling better. Reports feeling bloated in abdomen. Sclera noted to be yellow.

## 2019-09-01 NOTE — ED Notes (Signed)
Pt went outside 

## 2019-09-02 LAB — PROTIME-INR
INR: 1.1 (ref 0.8–1.2)
Prothrombin Time: 14 seconds (ref 11.4–15.2)

## 2019-09-02 NOTE — Consult Note (Signed)
Referring Provider:Kelly Shirlee Latch, PA-C Primary Care Physician:  System, Pcp Not In Primary Gastroenterologist:  Unassigned  Reason for Consultation:  Hepatitis B   HPI: Rachel Le is a 49 y.o. female with no pertinent past medical history presenting for consultation of acute hepatitis B.  Patient was recently hospitalized from 08/25/2019 to 08/27/2019 for acute hepatitis B infection.  Patient returns to the ER today because she was feeling more fatigued.  Denies any nausea, vomiting, GERD, dysphagia, unexplained weight loss, changes in stool, melena, or hematochezia.    Has occasional dyspepsia for which she takes omeprazole 20 mg daily.  Her boyfriend previously used IV drugs and was found to be positive for hepatitis B.  Patient denies any IV drug use.  Denies any history of blood transfusion.  Denies any medications.    Patient has abstained from alcohol since discharge and typically does not drink alcohol on a regular basis.  Past Medical History:  Diagnosis Date  . Hypertension     Past Surgical History:  Procedure Laterality Date  . bilateral knee surgery    . HIP FRACTURE SURGERY      Prior to Admission medications   Medication Sig Start Date End Date Taking? Authorizing Provider  lisinopril-hydrochlorothiazide (PRINZIDE,ZESTORETIC) 10-12.5 MG tablet Take 1 tablet by mouth daily.    [provider]  omeprazole (PRILOSEC) 20 MG capsule Take 1 capsule (20 mg total) by mouth daily. 05/17/17   Wallis Bamberg, PA-C    Scheduled Meds: Continuous Infusions: PRN Meds:.  Allergies as of 09/01/2019  . (No Known Allergies)    Family History  Problem Relation Age of Onset  . Hypertension Mother   . Breast cancer Neg Hx     Social History   Socioeconomic History  . Marital status: Married    Spouse name: Not on file  . Number of children: 0  . Years of education: Not on file  . Highest education level: High school graduate  Occupational History  .  Not on file  Tobacco Use  . Smoking status: Never Smoker  . Smokeless tobacco: Never Used  Vaping Use  . Vaping Use: Never used  Substance and Sexual Activity  . Alcohol use: Yes    Comment: occassionally  . Drug use: No  . Sexual activity: Yes    Birth control/protection: Condom  Other Topics Concern  . Not on file  Social History Narrative  . Not on file   Social Determinants of Health   Financial Resource Strain:   . Difficulty of Paying Living Expenses:   Food Insecurity:   . Worried About Programme researcher, broadcasting/film/video in the Last Year:   . Barista in the Last Year:   Transportation Needs: No Transportation Needs  . Lack of Transportation (Medical): No  . Lack of Transportation (Non-Medical): No  Physical Activity:   . Days of Exercise per Week:   . Minutes of Exercise per Session:   Stress:   . Feeling of Stress :   Social Connections:   . Frequency of Communication with Friends and Family:   . Frequency of Social Gatherings with Friends and Family:   . Attends Religious Services:   . Active Member of Clubs or Organizations:   . Attends Banker Meetings:   Marland Kitchen Marital Status:   Intimate Partner Violence:   . Fear of Current or Ex-Partner:   . Emotionally Abused:   Marland Kitchen Physically Abused:   . Sexually Abused:  Review of Systems: Review of Systems  Constitutional: Positive for malaise/fatigue. Negative for fever.  HENT: Negative for hearing loss and tinnitus.   Eyes: Negative for pain and redness.  Respiratory: Negative for cough and shortness of breath.   Cardiovascular: Negative for chest pain and palpitations.  Gastrointestinal: Negative for abdominal pain, blood in stool, constipation, diarrhea, heartburn, melena, nausea and vomiting.  Genitourinary: Negative for flank pain and hematuria.  Musculoskeletal: Negative for falls and joint pain.  Skin: Negative for itching and rash.  Neurological: Negative for seizures and loss of consciousness.   Endo/Heme/Allergies: Negative for polydipsia. Does not bruise/bleed easily.  Psychiatric/Behavioral: Negative for substance abuse. The patient is not nervous/anxious.       Physical Exam: Vital signs: Vitals:   09/02/19 1345 09/02/19 1400  BP: 116/71 121/70  Pulse: 74 76  Resp: 16   Temp:    SpO2: 98% 99%     Physical Exam Constitutional:      General: She is not in acute distress.    Appearance: Normal appearance.  HENT:     Head: Normocephalic and atraumatic.     Mouth/Throat:     Mouth: Mucous membranes are moist.     Pharynx: Oropharynx is clear.  Eyes:     General: Scleral icterus present.     Extraocular Movements: Extraocular movements intact.  Cardiovascular:     Rate and Rhythm: Normal rate and regular rhythm.     Pulses: Normal pulses.     Heart sounds: Normal heart sounds.  Pulmonary:     Effort: Pulmonary effort is normal. No respiratory distress.     Breath sounds: Normal breath sounds.  Abdominal:     General: Bowel sounds are normal. There is no distension.     Palpations: Abdomen is soft. There is no mass.     Tenderness: There is no abdominal tenderness. There is no guarding or rebound.     Hernia: No hernia is present.  Musculoskeletal:        General: No swelling or tenderness.     Cervical back: Normal range of motion and neck supple.  Skin:    General: Skin is warm and dry.     Coloration: Skin is jaundiced.  Neurological:     General: No focal deficit present.     Mental Status: She is alert and oriented to person, place, and time.  Psychiatric:        Mood and Affect: Mood normal.        Behavior: Behavior normal.      GI:  Lab Results: Recent Labs    09/01/19 2102  WBC 6.5  HGB 12.9  HCT 39.3  PLT 241   BMET Recent Labs    09/01/19 2102  NA 136  K 4.4  CL 103  CO2 24  GLUCOSE 92  BUN 7  CREATININE 0.36*  CALCIUM 8.9   LFT Recent Labs    09/01/19 2102  PROT 7.3  ALBUMIN 3.3*  AST 2,252*  ALT 3,165*   ALKPHOS 180*  BILITOT 12.9*   PT/INR Recent Labs    09/02/19 1130  LABPROT 14.0  INR 1.1     Studies/Results: No results found.  Impression: Acute hepatitis B infection without encephalopathy or coagulopathy -T bili 12.9/AST 2252/ALT 3165/ALP 180 as of 8/12, increased from T bili 7.0/AST 1513/ALT 2324/ALP 168 as of 08/27/2019 -INR 1.1 today  Plan: Discussed with patient hospitalization versus close outpatient management.  Patient prefers outpatient therapy.  Continue to avoid alcohol,  Tylenol, and other hepatotoxic agents.  Follow-up with [facility] on Monday 8/16 for repeat CMP and PT/INR.  Patient advised to return to the ED if any signs of confusion develop.  Patient advised to have a family member or friend call every 3-6 hours to check on her and assess for any confusion.  Acute hepatitis B will likely resolve without treatment, though we will continue to trend LFTs as an outpatient and consider tenofovir if necessary.  Okay to discharge patient from a GI standpoint.  [facility] will sign off.  Please contact us if we can be of any further assistance during this hospital stay.    LOS: 0 days   Edrick Kins  PA-C 09/02/2019, 2:19 PM  Contact #  [phone]

## 2019-09-02 NOTE — Discharge Instructions (Signed)
You are seen today for worsening liver tests.  You are seen by the GI specialist.  They gave you the option of being observed overnight or repeating your labs on Monday.  You chose to repeat your labs on Monday.  This is reasonable since you are not having any symptoms.  Please make sure you see their office on Monday to have your test redone.  Please make sure you are avoiding any alcohol or Tylenol. Thank you for allowing me to care for you today. Please return to the emergency department if you have new or worsening symptoms. Take your medications as instructed.

## 2019-09-02 NOTE — ED Provider Notes (Signed)
[facility] EMERGENCY DEPARTMENT Provider Note   CSN: [id number] Arrival date & time: 09/01/19  1915     History Chief Complaint  Patient presents with  . Abnormal Lab    [name] is a 49 y.o. female.  Patient is a 49 year old female with past medical history of hypertension and recently diagnosed hepatitis B after a lengthy hospital stay presenting to the emergency department for abnormal labs.  Patient reports that she was discharged from the hospital and actually improved from her symptoms which previously included abdominal pain, nausea and vomiting.  She denies any current abdominal pain, nausea, vomiting.  Reports that she saw her primary medical doctor who repeated her labs and noted that her LFTs are increasing and sent her for further evaluation to the emergency department.  She reports that she called GI but has not received a call back for scheduling an appointment with them.        Past Medical History:  Diagnosis Date  . Hypertension     Patient Active Problem List   Diagnosis Date Noted  . Acute hepatitis B virus infection 08/26/2019  . Transaminitis 08/25/2019  . Essential hypertension 08/25/2019  . RUQ pain 08/25/2019    Past Surgical History:  Procedure Laterality Date  . bilateral knee surgery    . HIP FRACTURE SURGERY       OB History    Gravida  5   Para      Term      Preterm      AB  5   Living        SAB  5   TAB      Ectopic      Multiple      Live Births              Family History  Problem Relation Age of Onset  . Hypertension Mother   . Breast cancer Neg Hx     Social History   Tobacco Use  . Smoking status: Never Smoker  . Smokeless tobacco: Never Used  Vaping Use  . Vaping Use: Never used  Substance Use Topics  . Alcohol use: Yes    Comment: occassionally  . Drug use: No    Home Medications Prior to Admission medications   Medication Sig Start Date End Date  Taking? Authorizing Provider  lisinopril-hydrochlorothiazide (PRINZIDE,ZESTORETIC) 10-12.5 MG tablet Take 1 tablet by mouth daily.    [provider]  omeprazole (PRILOSEC) 20 MG capsule Take 1 capsule (20 mg total) by mouth daily. 05/17/17   Wallis Bamberg, PA-C    Allergies    Patient has no known allergies.  Review of Systems   Review of Systems  Constitutional: Negative for appetite change, chills and fever.  HENT: Negative for congestion and sore throat.   Respiratory: Negative.   Cardiovascular: Negative for chest pain.  Gastrointestinal: Negative.  Negative for abdominal pain.  Genitourinary: Negative for dysuria.  Skin: Negative for rash.  Neurological: Negative for dizziness.  All other systems reviewed and are negative.   Physical Exam Updated Vital Signs BP 110/79 (BP Location: Right Arm)   Pulse 72   Temp 98.4 F (36.9 C) (Oral)   Resp 16   LMP 08/13/2019   SpO2 100%   Physical Exam Vitals and nursing note reviewed.  Constitutional:      General: She is not in acute distress.    Appearance: Normal appearance. She is not ill-appearing, toxic-appearing or diaphoretic.  HENT:     Head: Normocephalic.     Mouth/Throat:     Mouth: Mucous membranes are moist.  Eyes:     General: Scleral icterus present.     Pupils: Pupils are equal, round, and reactive to light.  Cardiovascular:     Rate and Rhythm: Normal rate and regular rhythm.  Pulmonary:     Effort: Pulmonary effort is normal.  Abdominal:     General: Abdomen is flat.     Palpations: Abdomen is soft.     Tenderness: There is no abdominal tenderness.  Skin:    General: Skin is dry.     Coloration: Skin is jaundiced.  Neurological:     Mental Status: She is alert.  Psychiatric:        Mood and Affect: Mood normal.     ED Results / Procedures / Treatments   Labs (all labs ordered are listed, but only abnormal results are displayed) Labs Reviewed  COMPREHENSIVE METABOLIC PANEL - Abnormal;  Notable for the following components:      Result Value   Creatinine, Ser 0.36 (*)    Albumin 3.3 (*)    AST 2,252 (*)    ALT 3,165 (*)    Alkaline Phosphatase 180 (*)    Total Bilirubin 12.9 (*)    All other components within normal limits  CBC - Abnormal; Notable for the following components:   RDW 15.8 (*)    All other components within normal limits  LIPASE, BLOOD  URINALYSIS, ROUTINE W REFLEX MICROSCOPIC  PROTIME-INR  I-STAT BETA HCG BLOOD, ED ([facility], [facility], [facility] ONLY)    EKG None  Radiology No results found.  Procedures Procedures (including critical care time)  Medications Ordered in ED Medications - No data to display  ED Course  I have reviewed the triage vital signs and the nursing notes.  Pertinent labs & imaging results that were available during my care of the patient were reviewed by me and considered in my medical decision making (see chart for details).    MDM Rules/Calculators/A&P                          Based on review of vitals, medical screening exam, lab work and/or imaging, there does not appear to be an acute, emergent etiology for the patient's symptoms. Counseled pt on good return precautions and encouraged both PCP and ED follow-up as needed.  Prior to discharge, I also discussed incidental imaging findings with patient in detail and advised appropriate, recommended follow-up in detail.  Clinical Impression: 1. Acute viral hepatitis B without coma and without delta agent     Disposition: Discharge  Prior to providing a prescription for a controlled substance, I independently reviewed the patient's recent prescription history on the [place] Controlled Substance Reporting System. The patient had no recent or regular prescriptions and was deemed appropriate for a brief, less than 3 day prescription of narcotic for acute analgesia.  This note was prepared with assistance of Conservation officer, historic buildings. Occasional wrong-word or  sound-a-like substitutions may have occurred due to the inherent limitations of voice recognition software.  Final Clinical Impression(s) / ED Diagnoses Final diagnoses:  None    Rx / DC Orders ED Discharge Orders    None       Jeral Pinch 09/02/19 1413    Lorre Nick, MD 09/03/19 1418

## 2019-09-02 NOTE — ED Provider Notes (Signed)
Medical screening examination/treatment/procedure(s) were conducted as a shared visit with non-physician practitioner(s) and myself.  I personally evaluated the patient during the encounter.    49 year old female recently diagnosed hepatitis B presents due to worsening LFTs.  She denies any confusion and appears at her baseline.  Will consult GI for guidance   Lorre Nick, MD 09/02/19 1332

## 2020-02-25 IMAGING — MG DIGITAL DIAGNOSTIC UNILATERAL RIGHT MAMMOGRAM WITH TOMO AND CAD
4 series · 4 of 12 positions shown · non-contrast
Comparison: Previous exam(s).

CLINICAL DATA: 47-year-old female recalled from screening mammogram
dated 04/08/2018 for a possible right breast mass.

EXAM:
DIGITAL DIAGNOSTIC RIGHT MAMMOGRAM WITH CAD AND TOMO
ULTRASOUND RIGHT BREAST

[R ML synth-2D]
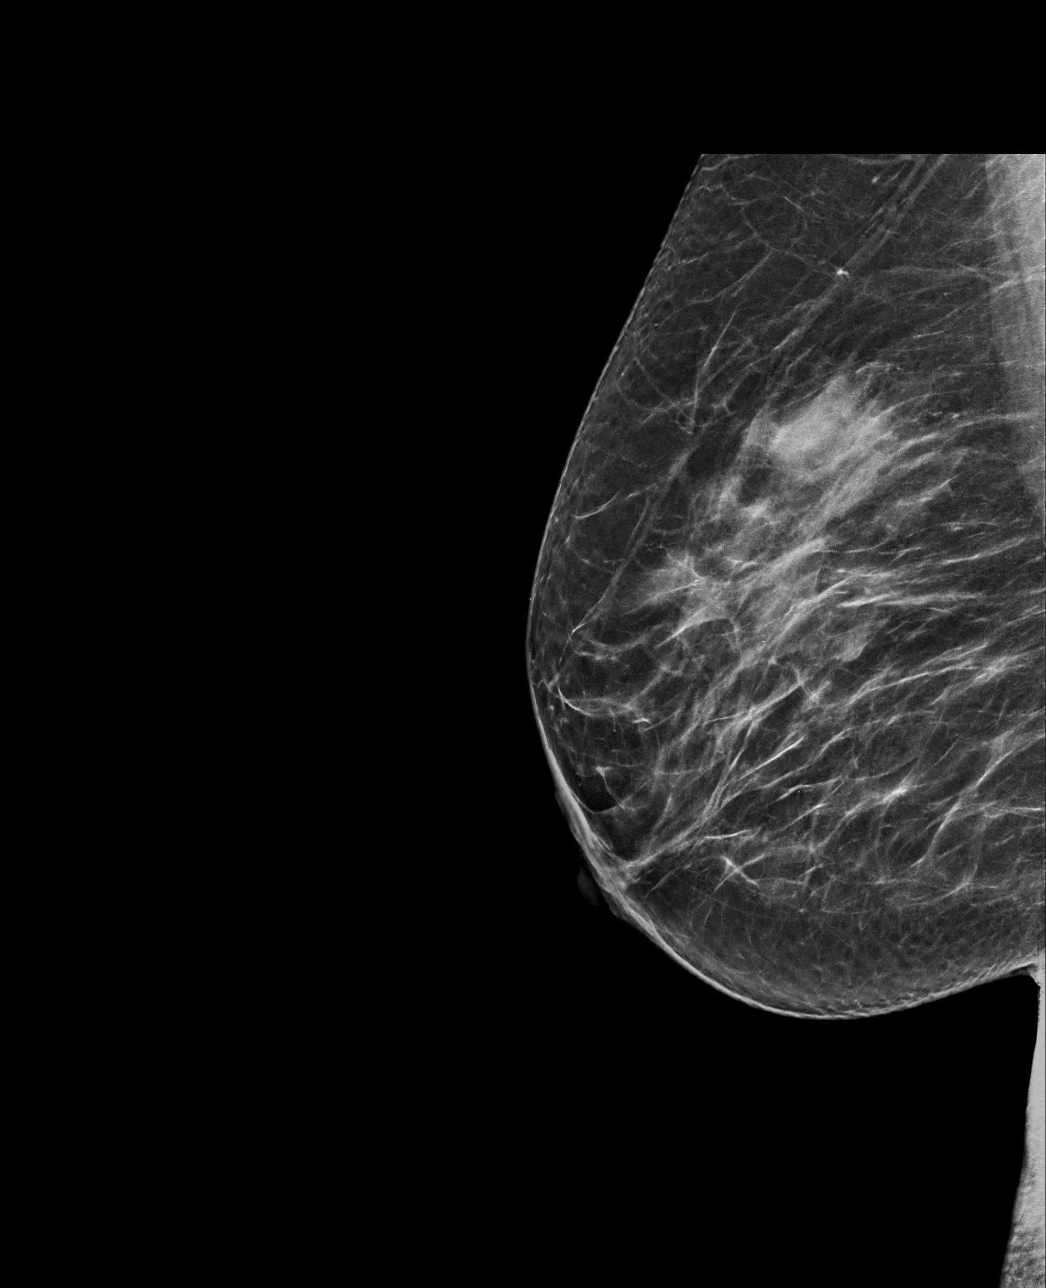

[R MLO synth-2D]
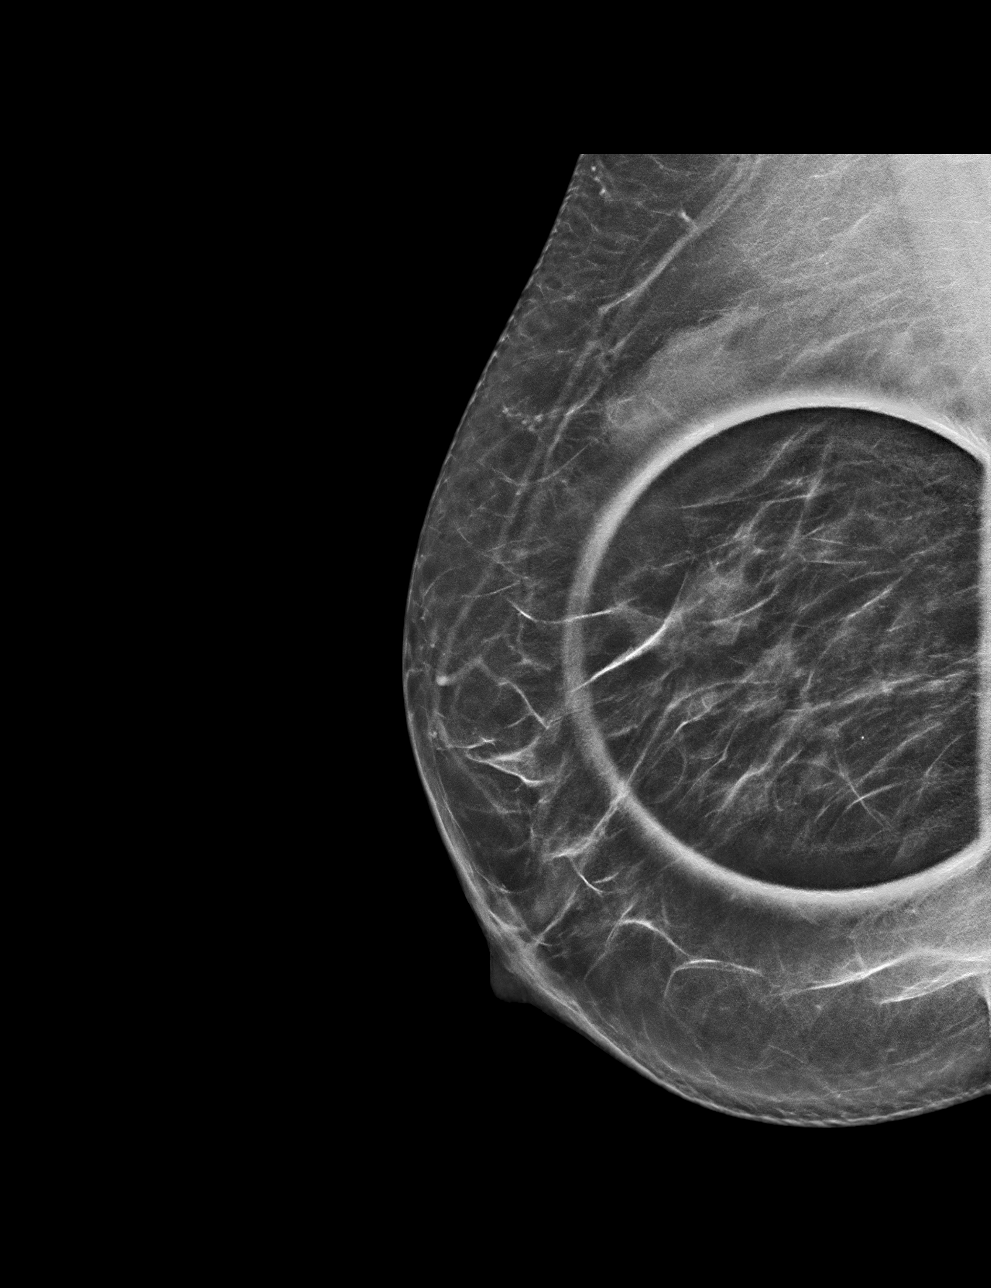

[R MLO tomo · tomo slice 35/69.0]
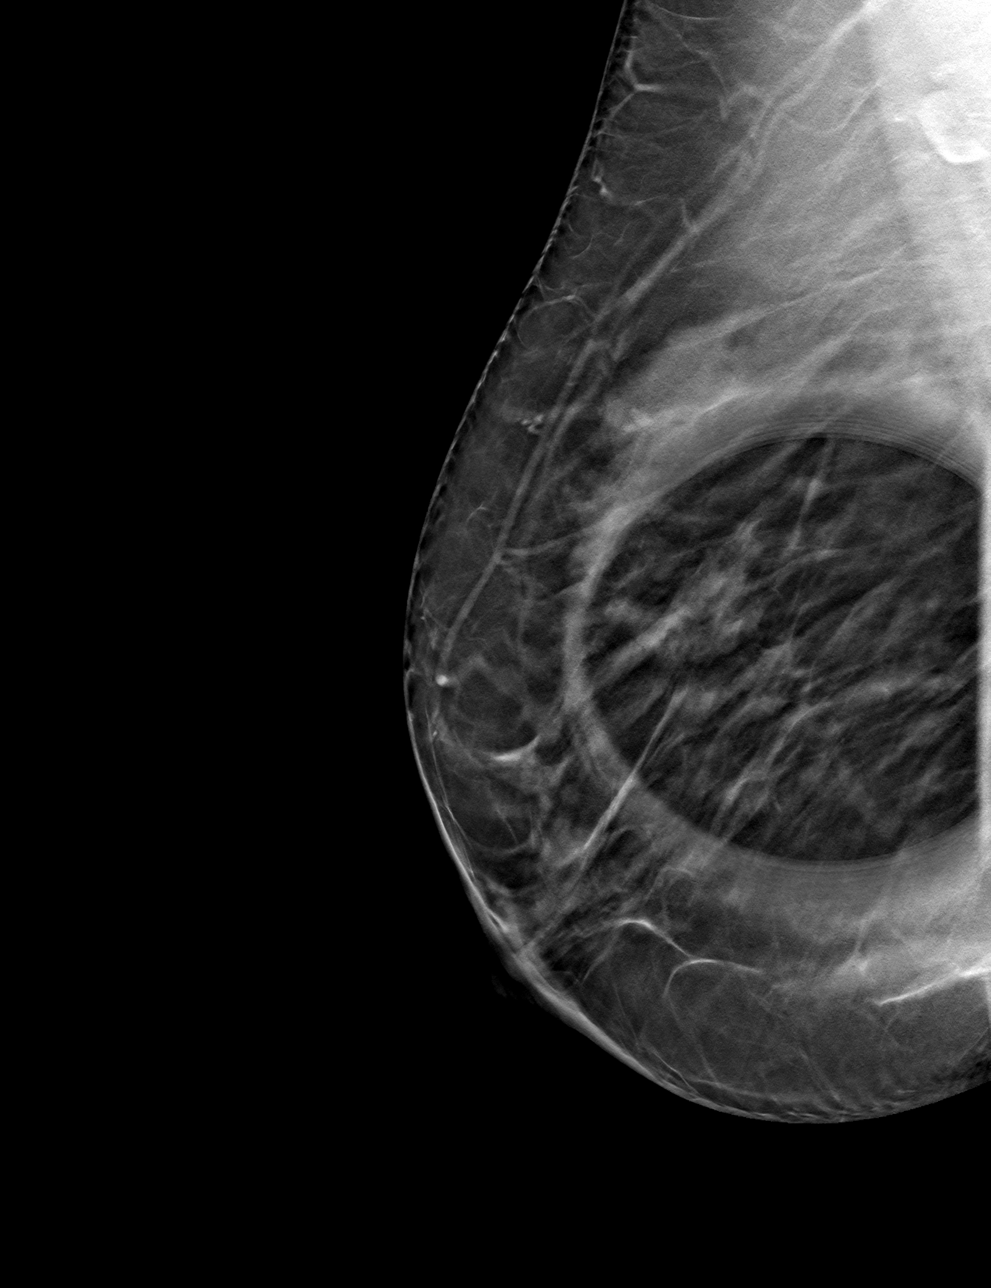

[R ML tomo · tomo slice 35/70.0]
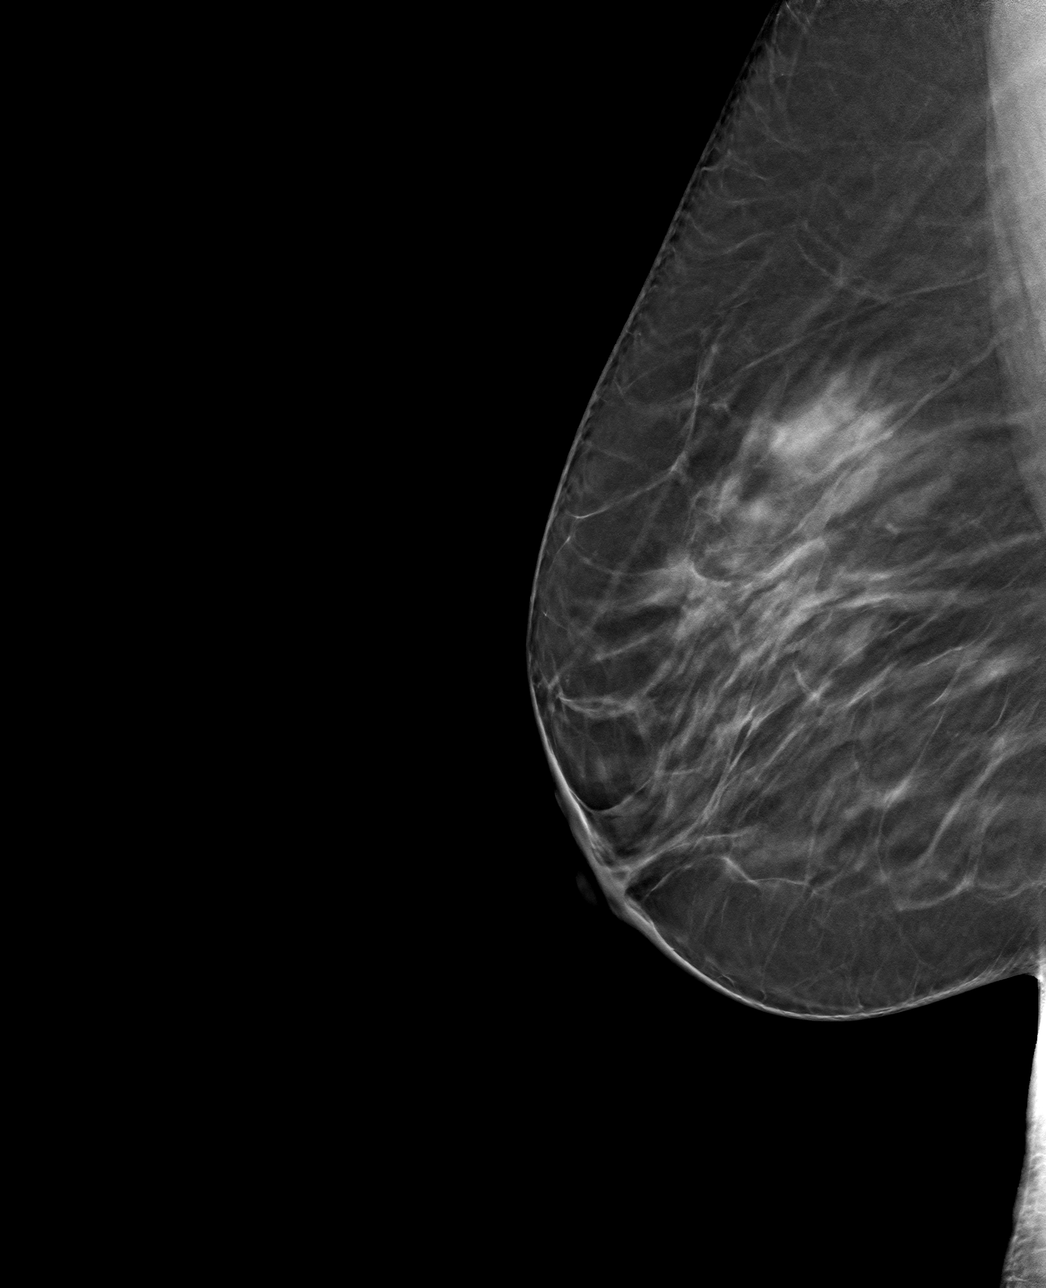

[4 of 12 positions shown; findings below may reference images not displayed]

ACR Breast Density Category c: The breast tissue is heterogeneously
dense, which may obscure small masses.
FINDINGS: Previously described, possible mass in the central right breast at
mid to posterior depth persists on today's additional views. It is
partially circumscribed and partially obscured. It localizes
laterally on tomosynthesis. Further evaluation with ultrasound was
performed.

Mammographic images were processed with CAD.

Targeted ultrasound is performed, showing 2 adjacent oval,
circumscribed anechoic masses at the 9 o'clock position 6 cm from
the nipple. They measure 7 x 7 x 4 mm and 7 x 5 x 2 mm. There is no
associated vascularity. One of these likely corresponds with the
mammographic finding.
IMPRESSION: Benign simple cyst corresponding with the screening mammographic
finding. No further imaging follow-up required.

RECOMMENDATION:
Screening mammogram in one year.(Code:BT-5-TMU)

I have discussed the findings and recommendations with the patient.
Results were also provided in writing at the conclusion of the
visit. If applicable, a reminder letter will be sent to the patient
regarding the next appointment.

BI-RADS CATEGORY  2: Benign.

## 2020-04-17 ENCOUNTER — Other Ambulatory Visit: Payer: Self-pay | Admitting: Obstetrics and Gynecology

## 2020-04-17 DIAGNOSIS — Z1231 Encounter for screening mammogram for malignant neoplasm of breast: Secondary | ICD-10-CM

## 2020-05-08 ENCOUNTER — Ambulatory Visit
Admission: RE | Admit: 2020-05-08 | Discharge: 2020-05-08 | Disposition: A | Discharge status: Home / Self Care | Payer: No Typology Code available for payment source | Source: Ambulatory Visit | Attending: Obstetrics and Gynecology | Admitting: Obstetrics and Gynecology

## 2020-05-08 ENCOUNTER — Ambulatory Visit: Payer: Self-pay | Admitting: *Deleted

## 2020-05-08 ENCOUNTER — Other Ambulatory Visit: Payer: Self-pay

## 2020-05-08 VITALS — BP 112/82 | Wt 129.7 lb

## 2020-05-08 DIAGNOSIS — Z1239 Encounter for other screening for malignant neoplasm of breast: Secondary | ICD-10-CM

## 2020-05-08 DIAGNOSIS — Z1231 Encounter for screening mammogram for malignant neoplasm of breast: Secondary | ICD-10-CM

## 2020-05-08 NOTE — Progress Notes (Signed)
[name] is a 50 y.o. female who presents to [facility] clinic today with no complaints.    Pap Smear: Pap smear not completed today. Last Pap smear was 12/28/2017at [facility] and normal with negative HPV. Per patient has no history of an abnormal Pap smear. Last Pap smear result is available in Epic.   Physical exam: Breasts Breasts symmetrical. No skin abnormalities bilateral breasts. No nipple retraction bilateral breasts. No nipple discharge bilateral breasts. No lymphadenopathy. No lumps palpated bilateral breasts. No complaints of pain or tenderness on exam.      MS DIGITAL SCREENING BILATERAL  Result Date: 01/17/2016 CLINICAL DATA:  Screening. EXAM: DIGITAL SCREENING BILATERAL MAMMOGRAM WITH CAD COMPARISON:  Previous exam(s). ACR Breast Density Category b: There are scattered areas of fibroglandular density. FINDINGS: There are no findings suspicious for malignancy. Images were processed with CAD. IMPRESSION: No mammographic evidence of malignancy. A result letter of this screening mammogram will be mailed directly to the patient. RECOMMENDATION: Screening mammogram in one year. (Code:SM-B-01Y) BI-RADS CATEGORY  1: Negative. Electronically Signed   By: Amie Portland M.D.   On: 01/23/2016 14:49   MS DIGITAL SCREENING TOMO BILATERAL  Result Date: 05/06/2019 CLINICAL DATA:  Screening. EXAM: DIGITAL SCREENING BILATERAL MAMMOGRAM WITH TOMO AND CAD COMPARISON:  Previous exam(s). ACR Breast Density Category c: The breast tissue is heterogeneously dense, which may obscure small masses. FINDINGS: There are no findings suspicious for malignancy. Images were processed with CAD. IMPRESSION: No mammographic evidence of malignancy. A result letter of this screening mammogram will be mailed directly to the patient. RECOMMENDATION: Screening mammogram in one year. (Code:SM-B-01Y) BI-RADS CATEGORY  1: Negative. Electronically Signed   By: Britta Mccreedy M.D.   On: 05/06/2019 09:47   MS  DIGITAL SCREENING TOMO BILATERAL  Result Date: 04/08/2018 CLINICAL DATA:  Screening. EXAM: DIGITAL SCREENING BILATERAL MAMMOGRAM WITH TOMO AND CAD COMPARISON:  Previous exam(s). ACR Breast Density Category b: There are scattered areas of fibroglandular density. FINDINGS: In the right breast, a possible mass warrants further evaluation. In the left breast, no findings suspicious for malignancy. Images were processed with CAD. IMPRESSION: Further evaluation is suggested for possible mass in the right breast. RECOMMENDATION: Diagnostic mammogram and possibly ultrasound of the right breast. (Code:FI-R-15M) The patient will be contacted regarding the findings, and additional imaging will be scheduled. BI-RADS CATEGORY  0: Incomplete. Need additional imaging evaluation and/or prior mammograms for comparison. Electronically Signed   By: Elberta Fortis M.D.   On: 04/08/2018 11:28   MS DIGITAL SCREENING TOMO BILATERAL  Result Date: 04/02/2017 CLINICAL DATA:  Screening. EXAM: DIGITAL SCREENING BILATERAL MAMMOGRAM WITH TOMO AND CAD COMPARISON:  Previous exam(s). ACR Breast Density Category b: There are scattered areas of fibroglandular density. FINDINGS: There are no findings suspicious for malignancy. Images were processed with CAD. IMPRESSION: No mammographic evidence of malignancy. A result letter of this screening mammogram will be mailed directly to the patient. RECOMMENDATION: Screening mammogram in one year. (Code:SM-B-01Y) BI-RADS CATEGORY  1: Negative. Electronically Signed   By: Ted Mcalpine M.D.   On: 04/02/2017 16:20   MS DIGITAL DIAG TOMO UNI RIGHT  Result Date: 04/21/2018 CLINICAL DATA:  50 year old female recalled from screening mammogram dated 04/08/2018 for a possible right breast mass. EXAM: DIGITAL DIAGNOSTIC RIGHT MAMMOGRAM WITH CAD AND TOMO ULTRASOUND RIGHT BREAST COMPARISON:  Previous exam(s). ACR Breast Density Category c: The breast tissue is heterogeneously dense, which may obscure small  masses. FINDINGS: Previously described, possible mass in the central right breast  at mid to posterior depth persists on today's additional views. It is partially circumscribed and partially obscured. It localizes laterally on tomosynthesis. Further evaluation with ultrasound was performed. Mammographic images were processed with CAD. Targeted ultrasound is performed, showing 2 adjacent oval, circumscribed anechoic masses at the 9 o'clock position 6 cm from the nipple. They measure 7 x 7 x 4 mm and 7 x 5 x 2 mm. There is no associated vascularity. One of these likely corresponds with the mammographic finding. IMPRESSION: Benign simple cyst corresponding with the screening mammographic finding. No further imaging follow-up required. RECOMMENDATION: Screening mammogram in one year.(Code:SM-B-01Y) I have discussed the findings and recommendations with the patient. Results were also provided in writing at the conclusion of the visit. If applicable, a reminder letter will be sent to the patient regarding the next appointment. BI-RADS CATEGORY  2: Benign. Electronically Signed   By: Sande Brothers M.D.   On: 04/21/2018 15:10   Pelvic/Bimanual Pap is not indicated today per BCCCP guidelines.   Smoking History: Patient has never smoked.   Patient Navigation: Patient education provided. Access to services provided for patient through BCCCP program.   Colorectal Cancer Screening: Per patient has never had colonoscopy completed. No complaints today.    Breast and Cervical Cancer Risk Assessment: Patient does not have family history of breast cancer, known genetic mutations, or radiation treatment to the chest before age 40. Patient does not have history of cervical dysplasia, immunocompromised, or DES exposure in-utero.  Risk Assessment    Risk Scores      05/08/2020 05/05/2019   Last edited by: Rachel Le, CMA McGill, Sherie Demetrius Charity, LPN   5-year risk: 0.6 % 0.6 %   Lifetime risk: 6.7 % 6.8 %          A: BCCCP exam without pap smear No complaints.  P: Referred patient to the [facility] for a screening mammogram on the mobile unit. Appointment scheduled Tuesday, May 08, 2020 at 1120.  Priscille Heidelberg, RN 05/08/2020 10:43 AM

## 2020-05-08 NOTE — Patient Instructions (Signed)
Explained breast self awareness with MA Hedy Camara. Patient did not need a Pap smear today due to last Pap smear and HPV typing was 01/17/2016. Let her know BCCCP will cover Pap smears and HPV typing every 5 years unless has a history of abnormal Pap smears. Referred patient to the [facility] for a screening mammogram on the mobile unit. Appointment scheduled Tuesday, May 08, 2020 at 1120. Patient escorted to the mobile unit following BCCCP appointment for her screening mammogram. Let patient know the Breast Center will follow up with her within the next couple weeks with results of her mammogram by letter or phone. MA Ivonne Selena Lesser verbalized understanding.  Earl Losee, Kathaleen Maser, RN 10:43 AM

## 2021-04-16 ENCOUNTER — Other Ambulatory Visit: Payer: Self-pay

## 2021-04-16 DIAGNOSIS — Z1211 Encounter for screening for malignant neoplasm of colon: Secondary | ICD-10-CM

## 2021-06-25 ENCOUNTER — Ambulatory Visit
Admission: RE | Admit: 2021-06-25 | Discharge: 2021-06-25 | Disposition: A | Discharge status: Home / Self Care | Payer: No Typology Code available for payment source | Source: Ambulatory Visit | Attending: Obstetrics and Gynecology | Admitting: Obstetrics and Gynecology

## 2021-06-25 ENCOUNTER — Ambulatory Visit: Payer: Self-pay | Admitting: *Deleted

## 2021-06-25 VITALS — BP 142/90 | Wt 135.6 lb

## 2021-06-25 DIAGNOSIS — Z1211 Encounter for screening for malignant neoplasm of colon: Secondary | ICD-10-CM

## 2021-06-25 DIAGNOSIS — Z01419 Encounter for gynecological examination (general) (routine) without abnormal findings: Secondary | ICD-10-CM

## 2021-06-25 NOTE — Patient Instructions (Signed)
Explained breast self awareness with MA Hedy Camara. Pap smear completed today. Let her know BCCCP will cover Pap smears and HPV typing every 5 years unless has a history of abnormal Pap smears. Referred patient to the [facility] for a screening mammogram on the mobile unit. Appointment scheduled Tuesday, June 25, 2021 at 1000. Patient aware of appointment and will be there. Let patient know will follow up with her within the next week with results of Pap smear and wet prep by phone. Informed patient that the Breast Center will follow up with her within the next couple of weeks with results of mammogram by letter or phone. MA Ivonne Selena Lesser verbalized understanding.  Roniel Halloran, Kathaleen Maser, RN 9:24 AM

## 2021-06-25 NOTE — Progress Notes (Signed)
Rachel Le is a 51 y.o. G5P0050 female who presents to [facility] clinic today with no complaints.    Pap Smear: Pap smear completed today. Last Pap smear was 01/17/2016 at [facility] and normal with negative HPV. Per patient has no history of an abnormal Pap smear. Last Pap smear result is available in Epic.   Physical exam: Breasts Breasts symmetrical. No skin abnormalities bilateral breasts. No nipple retraction bilateral breasts. No nipple discharge bilateral breasts. No lymphadenopathy. No lumps palpated bilateral breasts. No complaints of pain or tenderness on exam.     MS DIGITAL SCREENING TOMO BILATERAL  Result Date: 05/09/2020 CLINICAL DATA:  Screening. EXAM: DIGITAL SCREENING BILATERAL MAMMOGRAM WITH TOMOSYNTHESIS AND CAD TECHNIQUE: Bilateral screening digital craniocaudal and mediolateral oblique mammograms were obtained. Bilateral screening digital breast tomosynthesis was performed. The images were evaluated with computer-aided detection. COMPARISON:  Previous exam(s). ACR Breast Density Category b: There are scattered areas of fibroglandular density. FINDINGS: There are no findings suspicious for malignancy. The images were evaluated with computer-aided detection. IMPRESSION: No mammographic evidence of malignancy. A result letter of this screening mammogram will be mailed directly to the patient. RECOMMENDATION: Screening mammogram in one year. (Code:SM-B-01Y) BI-RADS CATEGORY  1: Negative. Electronically Signed   By: Amie Portland M.D.   On: 05/09/2020 08:18   MS DIGITAL SCREENING TOMO BILATERAL  Result Date: 05/06/2019 CLINICAL DATA:  Screening. EXAM: DIGITAL SCREENING BILATERAL MAMMOGRAM WITH TOMO AND CAD COMPARISON:  Previous exam(s). ACR Breast Density Category c: The breast tissue is heterogeneously dense, which may obscure small masses. FINDINGS: There are no findings suspicious for malignancy. Images were processed with CAD. IMPRESSION: No mammographic  evidence of malignancy. A result letter of this screening mammogram will be mailed directly to the patient. RECOMMENDATION: Screening mammogram in one year. (Code:SM-B-01Y) BI-RADS CATEGORY  1: Negative. Electronically Signed   By: Britta Mccreedy M.D.   On: 05/06/2019 09:47   MS DIGITAL SCREENING TOMO BILATERAL  Result Date: 04/08/2018 CLINICAL DATA:  Screening. EXAM: DIGITAL SCREENING BILATERAL MAMMOGRAM WITH TOMO AND CAD COMPARISON:  Previous exam(s). ACR Breast Density Category b: There are scattered areas of fibroglandular density. FINDINGS: In the right breast, a possible mass warrants further evaluation. In the left breast, no findings suspicious for malignancy. Images were processed with CAD. IMPRESSION: Further evaluation is suggested for possible mass in the right breast. RECOMMENDATION: Diagnostic mammogram and possibly ultrasound of the right breast. (Code:FI-R-23M) The patient will be contacted regarding the findings, and additional imaging will be scheduled. BI-RADS CATEGORY  0: Incomplete. Need additional imaging evaluation and/or prior mammograms for comparison. Electronically Signed   By: Elberta Fortis M.D.   On: 04/08/2018 11:28   MS DIGITAL SCREENING TOMO BILATERAL  Result Date: 04/02/2017 CLINICAL DATA:  Screening. EXAM: DIGITAL SCREENING BILATERAL MAMMOGRAM WITH TOMO AND CAD COMPARISON:  Previous exam(s). ACR Breast Density Category b: There are scattered areas of fibroglandular density. FINDINGS: There are no findings suspicious for malignancy. Images were processed with CAD. IMPRESSION: No mammographic evidence of malignancy. A result letter of this screening mammogram will be mailed directly to the patient. RECOMMENDATION: Screening mammogram in one year. (Code:SM-B-01Y) BI-RADS CATEGORY  1: Negative. Electronically Signed   By: Ted Mcalpine M.D.   On: 04/02/2017 16:20   MS DIGITAL DIAG TOMO UNI RIGHT  Result Date: 04/21/2018 CLINICAL DATA:  51 year old female recalled from  screening mammogram dated 04/08/2018 for a possible right breast mass. EXAM: DIGITAL DIAGNOSTIC RIGHT MAMMOGRAM WITH CAD AND TOMO ULTRASOUND RIGHT BREAST  COMPARISON:  Previous exam(s). ACR Breast Density Category c: The breast tissue is heterogeneously dense, which may obscure small masses. FINDINGS: Previously described, possible mass in the central right breast at mid to posterior depth persists on today's additional views. It is partially circumscribed and partially obscured. It localizes laterally on tomosynthesis. Further evaluation with ultrasound was performed. Mammographic images were processed with CAD. Targeted ultrasound is performed, showing 2 adjacent oval, circumscribed anechoic masses at the 9 o'clock position 6 cm from the nipple. They measure 7 x 7 x 4 mm and 7 x 5 x 2 mm. There is no associated vascularity. One of these likely corresponds with the mammographic finding. IMPRESSION: Benign simple cyst corresponding with the screening mammographic finding. No further imaging follow-up required. RECOMMENDATION: Screening mammogram in one year.(Code:SM-B-01Y) I have discussed the findings and recommendations with the patient. Results were also provided in writing at the conclusion of the visit. If applicable, a reminder letter will be sent to the patient regarding the next appointment. BI-RADS CATEGORY  2: Benign. Electronically Signed   By: Sande Brothers M.D.   On: 04/21/2018 15:10     Pelvic/Bimanual Ext Genitalia No lesions, no swelling and no discharge observed on external genitalia.        Vagina Vagina pink and normal texture. No lesions and thick white discharge observed in vagina. Wet prep completed.       Cervix Cervix is present. Cervix pink and of normal texture. Thick white discharge observed on cervix.    Uterus Uterus is present and palpable. Uterus in normal position and normal size.        Adnexae Bilateral ovaries present and palpable. No tenderness on palpation.          Rectovaginal No rectal exam completed today since patient had no rectal complaints. No skin abnormalities observed on exam.     Smoking History: Patient has never smoked.   Patient Navigation: Patient education provided. Access to services provided for patient through BCCCP program.   Colorectal Cancer Screening: Per patient has never had colonoscopy completed. Fit Test given to patient to complete. No complaints today.    Breast and Cervical Cancer Risk Assessment: Patient does not have family history of breast cancer, known genetic mutations, or radiation treatment to the chest before age 63. Patient does not have history of cervical dysplasia, immunocompromised, or DES exposure in-utero.  Risk Assessment     Risk Scores       06/25/2021 05/08/2020   Last edited by: Meryl Dare, CMA Meryl Dare, CMA   5-year risk: 0.7 % 0.6 %   Lifetime risk: 6.6 % 6.7 %            A: BCCCP exam with pap smear No complaints.  P: Referred patient to the [facility] for a screening mammogram on the mobile unit. Appointment scheduled Tuesday, June 25, 2021 at 1000.    Priscille Heidelberg, RN 06/25/2021 9:24 AM

## 2021-06-25 NOTE — Addendum Note (Signed)
Addended by: Lucilla Lame E on: 06/25/2021 11:10 AM   Modules accepted: Orders

## 2021-06-26 ENCOUNTER — Other Ambulatory Visit: Payer: Self-pay

## 2021-06-26 ENCOUNTER — Telehealth: Payer: Self-pay

## 2021-06-26 DIAGNOSIS — B9689 Other specified bacterial agents as the cause of diseases classified elsewhere: Secondary | ICD-10-CM

## 2021-06-26 LAB — CERVICOVAGINAL ANCILLARY ONLY
Bacterial Vaginitis (gardnerella): POSITIVE — AB
Candida Glabrata: NEGATIVE
Candida Vaginitis: NEGATIVE
Chlamydia: NEGATIVE
Comment: NEGATIVE
Comment: NEGATIVE
Comment: NEGATIVE
Comment: NEGATIVE
Comment: NORMAL
Neisseria Gonorrhea: NEGATIVE

## 2021-06-26 MED ORDER — METRONIDAZOLE 500 MG PO TABS: 500.0000 mg | ORAL_TABLET | Freq: Two times a day (BID) | ORAL | 0 refills | Status: AC

## 2021-06-26 NOTE — Telephone Encounter (Signed)
Left message for patient regarding lab results. Left name and number for patient to call back. 

## 2021-06-26 NOTE — Telephone Encounter (Signed)
Spoke with patient regarding wet prep results. Informed patient that wet prep showed bacterial vaginosis. Will need to prescribe Flagyl to be taken BID for 7 days, avoid alcohol while taking medication. Patient voiced understanding. Rx sent to [facility] in [facility].

## 2021-06-27 ENCOUNTER — Telehealth: Payer: Self-pay

## 2021-06-27 NOTE — Telephone Encounter (Signed)
Left message for patient about lab results. Left name and number for patient to call back. 

## 2021-06-30 IMAGING — US US ABDOMEN LIMITED
1 series · 14 of 25 positions shown · non-contrast
Comparison: None

CLINICAL DATA: Right upper quadrant pain.

EXAM:
ULTRASOUND ABDOMEN LIMITED RIGHT UPPER QUADRANT

[Series 1: us abdomen limited ruq · 14 of 33 slices shown]
[im 1/33]
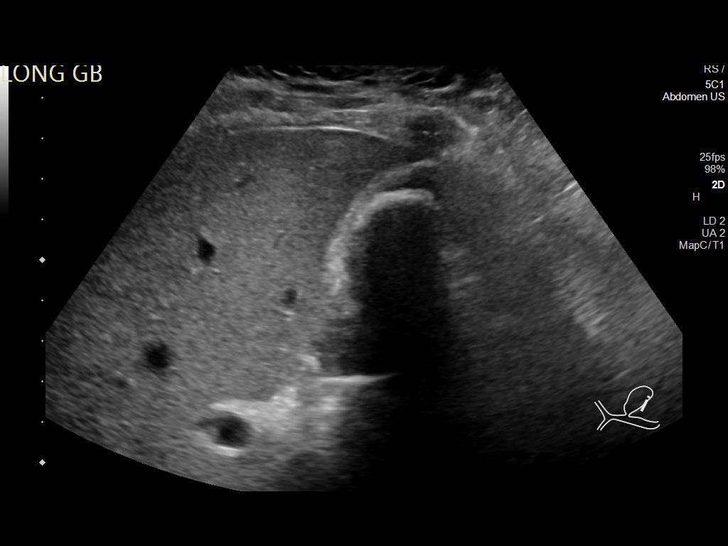
[im 3/33]
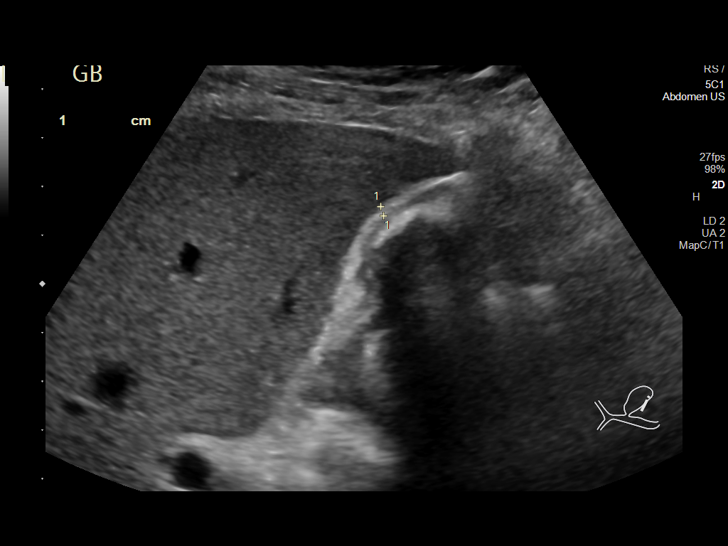
[im 6/33]
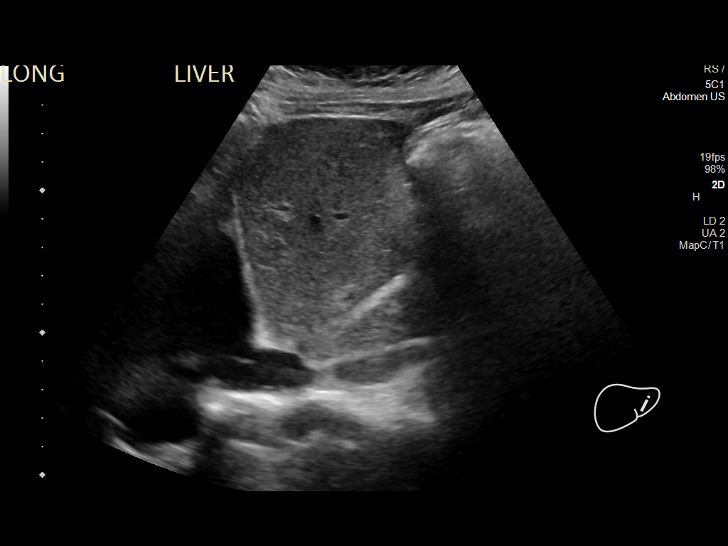
[im 9/33]
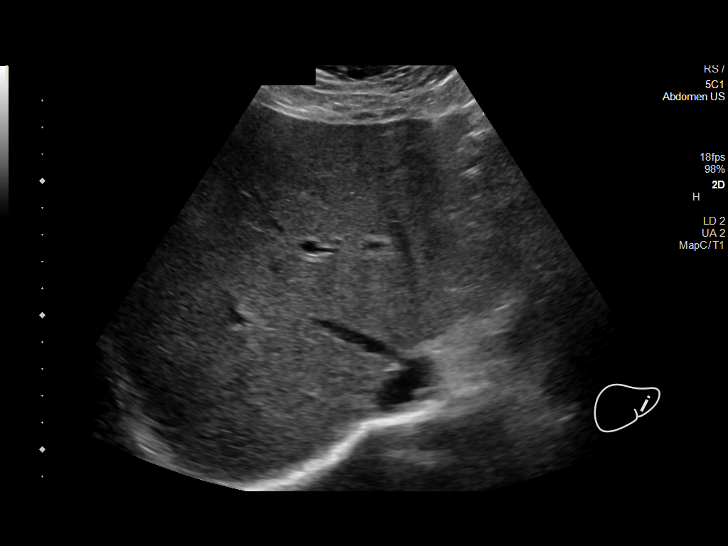
[im 11/33]
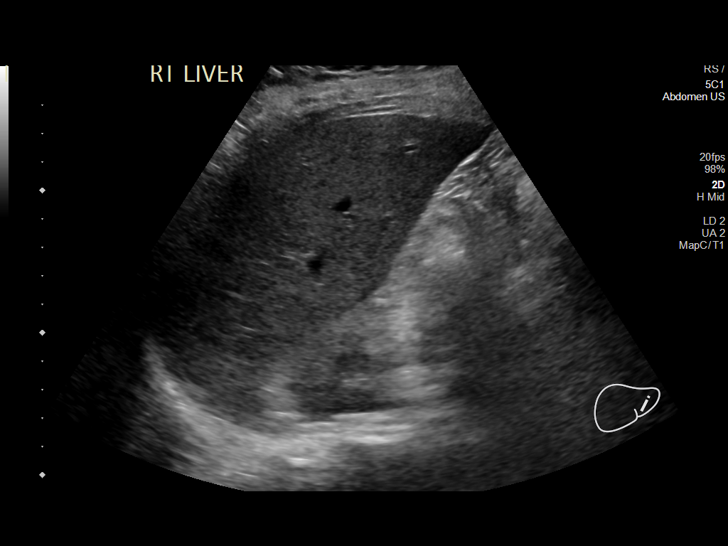
[im 13/33]
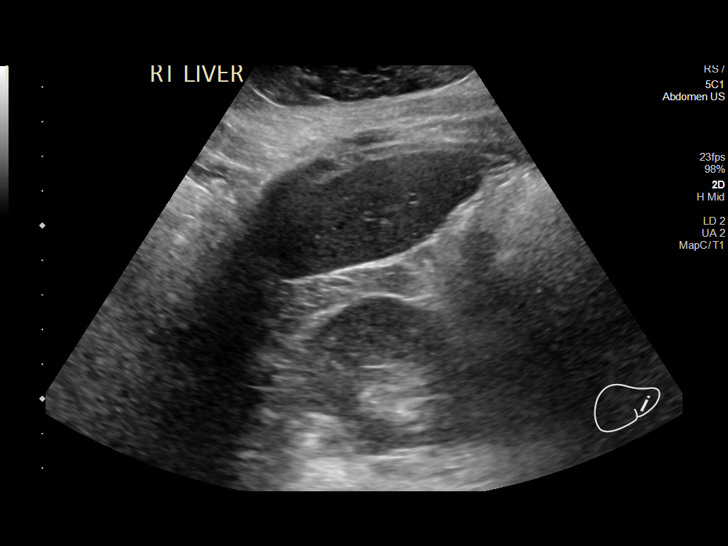
[im 15/33]
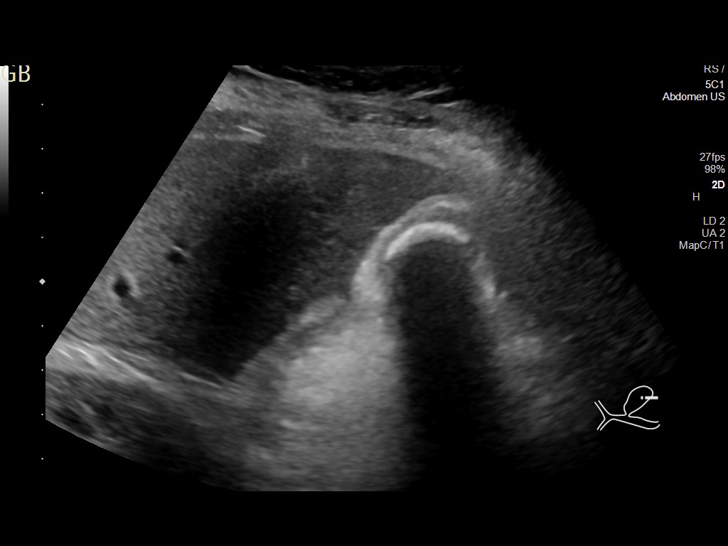
[im 18/33]
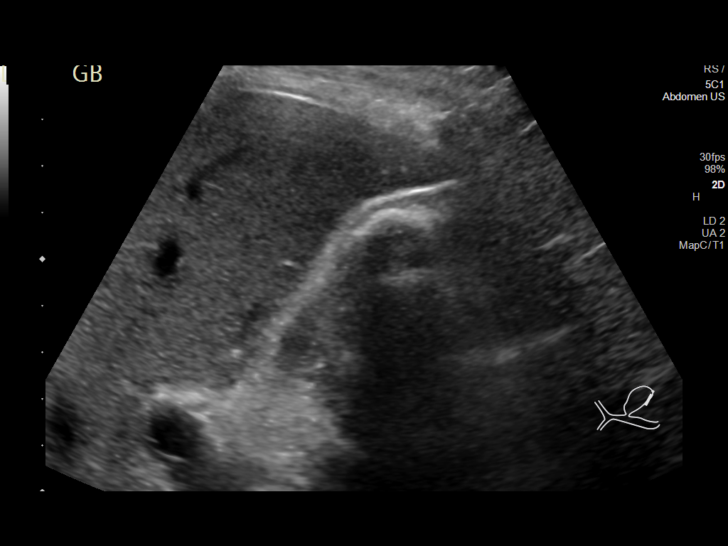
[im 21/33]
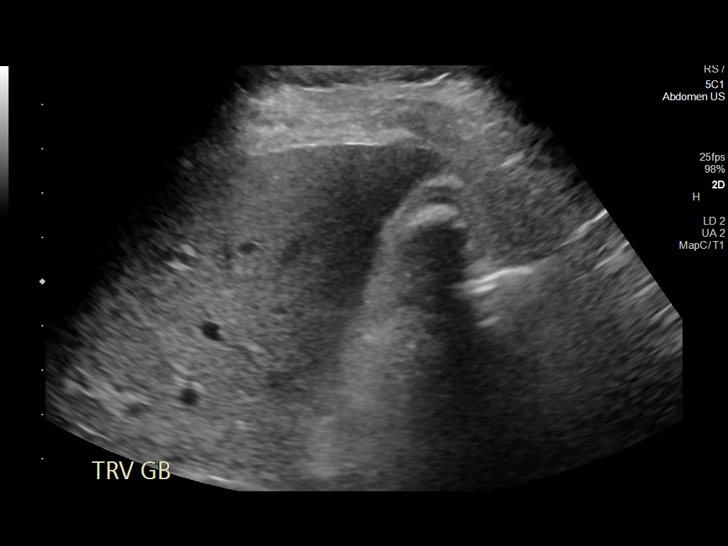
[im 22/33]
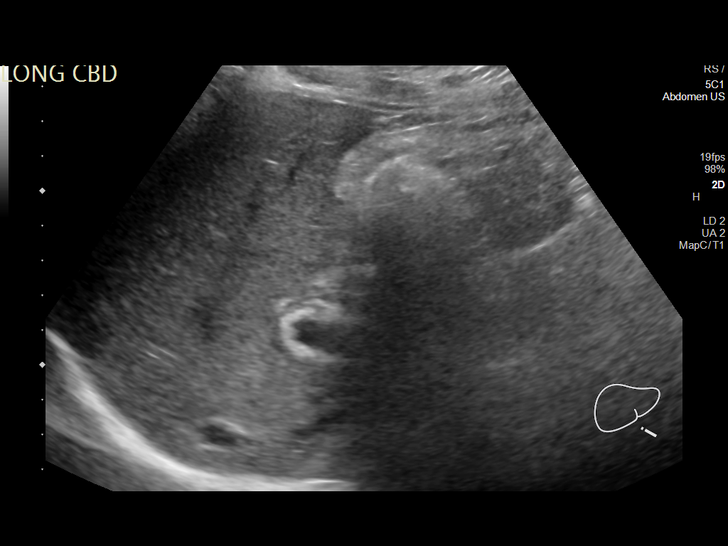
[im 25/33]
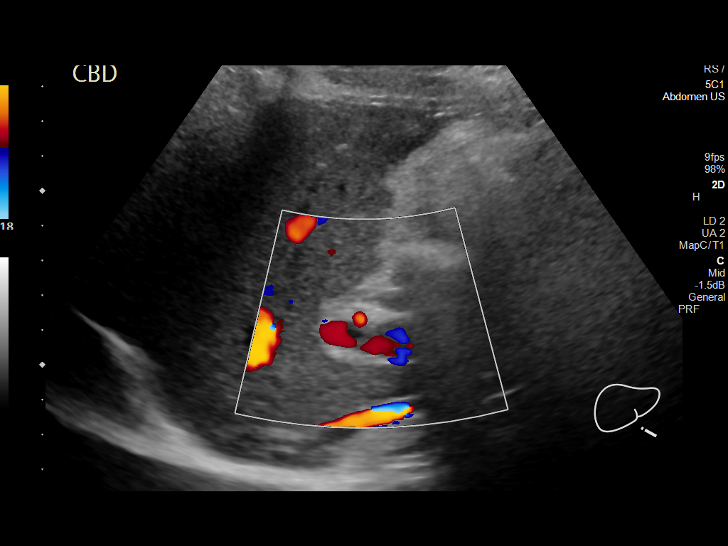
[im 27/33]
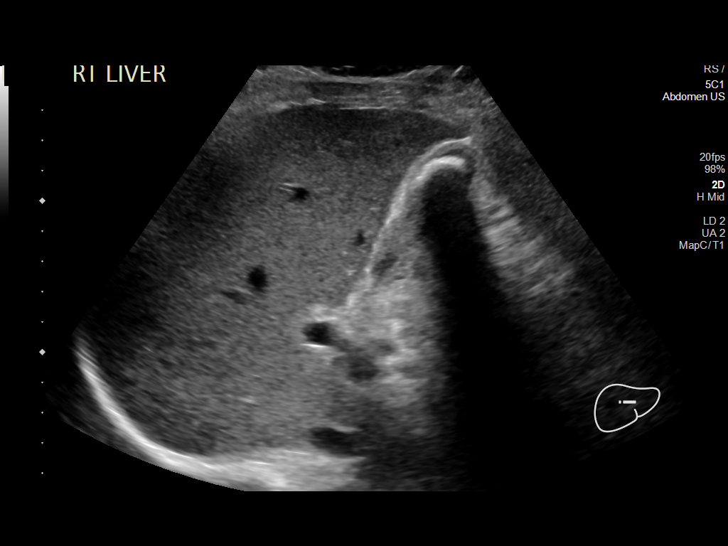
[im 30/33]
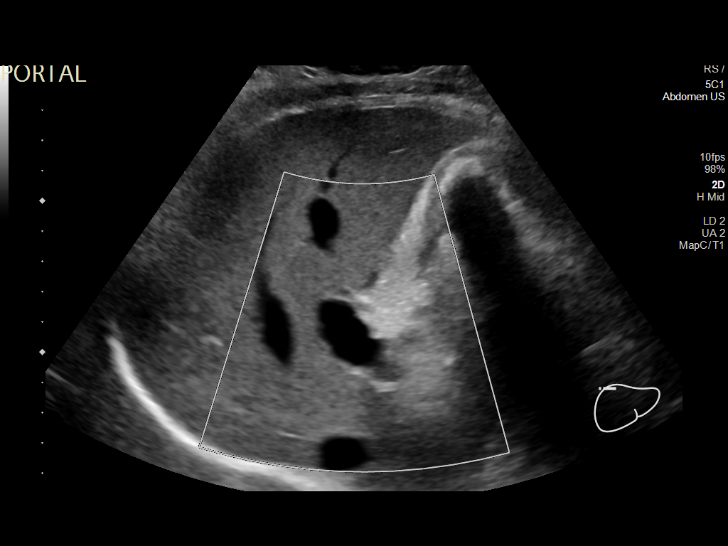
[im 33/33]
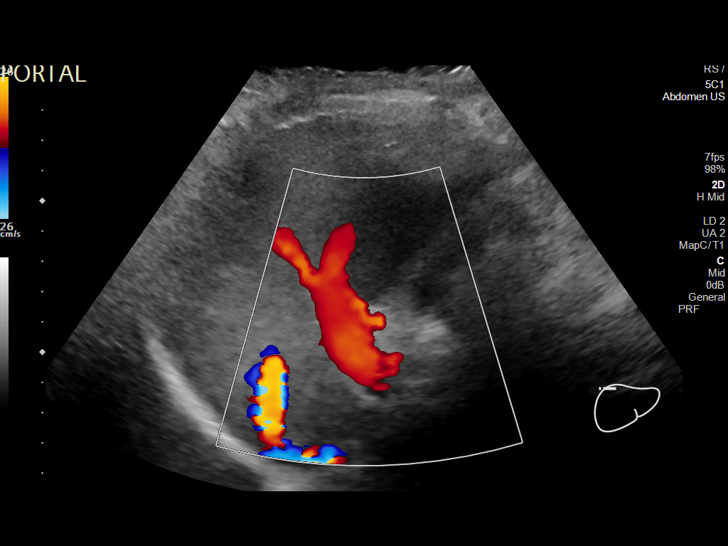

[14 of 25 positions shown; findings below may reference images not displayed]

FINDINGS: Gallbladder:

Intraluminal shadowing calculus measuring 1.9 cm. No wall thickening
visualized. No pericholecystic free fluid. No sonographic Murphy
sign noted by sonographer.

Common bile duct:

Diameter: 4.5 mm

Liver:

No focal lesion identified. Within normal limits in parenchymal
echogenicity. Portal vein is patent on color Doppler imaging with
normal direction of blood flow towards the liver.

Other: None.
IMPRESSION: Cholelithiasis without sonographic evidence of cholecystitis.

## 2021-07-03 LAB — CYTOLOGY - PAP
Comment: NEGATIVE
Comment: NEGATIVE
Comment: NEGATIVE
Diagnosis: NEGATIVE
HPV 16: POSITIVE — AB
HPV 18 / 45: NEGATIVE
High risk HPV: POSITIVE — AB

## 2021-07-04 NOTE — Telephone Encounter (Signed)
Per ASCCP guidelines patient needs colpo due to HPV type 16 strain. Will refer patient to [facility] for colpo. Will call patient with follow-up appointment information once scheduled. Patient voiced understanding.

## 2021-09-04 ENCOUNTER — Ambulatory Visit (INDEPENDENT_AMBULATORY_CARE_PROVIDER_SITE_OTHER): Payer: Self-pay | Admitting: Family Medicine

## 2021-09-04 ENCOUNTER — Other Ambulatory Visit: Payer: Self-pay

## 2021-09-04 ENCOUNTER — Other Ambulatory Visit (HOSPITAL_COMMUNITY)
Admission: RE | Admit: 2021-09-04 | Discharge: 2021-09-04 | Disposition: A | Discharge status: Home / Self Care | Payer: Self-pay | Source: Ambulatory Visit | Attending: Family Medicine | Admitting: Family Medicine

## 2021-09-04 ENCOUNTER — Encounter: Payer: Self-pay | Admitting: Family Medicine

## 2021-09-04 VITALS — BP 154/88 | HR 80 | Wt 140.0 lb

## 2021-09-04 DIAGNOSIS — N870 Mild cervical dysplasia: Secondary | ICD-10-CM

## 2021-09-04 DIAGNOSIS — Z3202 Encounter for pregnancy test, result negative: Secondary | ICD-10-CM

## 2021-09-04 DIAGNOSIS — R87810 Cervical high risk human papillomavirus (HPV) DNA test positive: Secondary | ICD-10-CM | POA: Insufficient documentation

## 2021-09-04 LAB — POCT PREGNANCY, URINE: Preg Test, Ur: NEGATIVE

## 2021-09-04 NOTE — Progress Notes (Signed)
    GYNECOLOGY CLINIC COLPOSCOPY PROCEDURE NOTE  51 y.o. 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  Pregnancy test: Lab Results  Component Value Date   PREGTESTUR NEGATIVE 09/04/2021    No Known Allergies  Patient given informed consent, signed copy in the chart, time out was performed.    Placed in lithotomy position. Cervix viewed with speculum and colposcope after application of acetic acid.   Colposcopy Adequacy Cervix fully visualized: Yes  SCJ fully visualized: No    Colposcopy Findings no visible lesions  Corresponding biopsies were not obtained.    ECC specimen was obtained.  All specimens were labeled and sent to pathology.  Hemostatic measures: Pressure and Monsel's solution  Complications: none  Patient tolerated the procedure well.  OBGyn Exam  Colposcopy Impressions Normal  Plan Treatment plan pending biopsy results, per patient preference they will be communicated by telephone.  Patient was given post procedure instructions.  Will follow up pathology and manage accordingly; patient will be contacted with results and recommendations.  Routine preventative health maintenance measures emphasized.  Venora Maples, MD/MPH Attending Family Medicine Physician, [facility] for [facility], [facility]

## 2021-09-06 LAB — SURGICAL PATHOLOGY

## 2022-05-26 ENCOUNTER — Other Ambulatory Visit: Payer: Self-pay

## 2022-05-26 DIAGNOSIS — Z1231 Encounter for screening mammogram for malignant neoplasm of breast: Secondary | ICD-10-CM

## 2022-07-03 ENCOUNTER — Ambulatory Visit: Payer: Self-pay | Admitting: Hematology and Oncology

## 2022-07-03 ENCOUNTER — Ambulatory Visit
Admission: RE | Admit: 2022-07-03 | Discharge: 2022-07-03 | Disposition: A | Discharge status: Home / Self Care | Payer: No Typology Code available for payment source | Source: Ambulatory Visit | Attending: Obstetrics and Gynecology | Admitting: Obstetrics and Gynecology

## 2022-07-03 VITALS — BP 145/78 | Ht <= 58 in | Wt 136.0 lb

## 2022-07-03 DIAGNOSIS — Z1231 Encounter for screening mammogram for malignant neoplasm of breast: Secondary | ICD-10-CM

## 2022-07-03 DIAGNOSIS — Z01419 Encounter for gynecological examination (general) (routine) without abnormal findings: Secondary | ICD-10-CM

## 2022-07-03 DIAGNOSIS — Z1211 Encounter for screening for malignant neoplasm of colon: Secondary | ICD-10-CM

## 2022-07-03 NOTE — Progress Notes (Signed)
Ms. MA Ellwood Handler Alohilani Levenhagen is a 52 y.o. G5P0050 female who presents to [facility] clinic today with no complaints.    Pap Smear: Pap smear completed today. Last Pap smear was 06/25/21 and was abnormal - Negative/ HPV+ . Per patient has no history of an abnormal Pap smear. Last Pap smear result is available in Epic.   Physical exam: Breasts Breasts symmetrical. No skin abnormalities bilateral breasts. No nipple retraction bilateral breasts. No nipple discharge bilateral breasts. No lymphadenopathy. No lumps palpated bilateral breasts.       Pelvic/Bimanual Ext Genitalia No lesions, no swelling and no discharge observed on external genitalia.        Vagina Vagina pink and normal texture. No lesions or discharge observed in vagina.        Cervix Cervix is present. Cervix pink and of normal texture. No discharge observed.    Uterus Uterus is present and palpable. Uterus in normal position and normal size.        Adnexae Bilateral ovaries present and palpable. No tenderness on palpation.         Rectovaginal No rectal exam completed today since patient had no rectal complaints. No skin abnormalities observed on exam.     Smoking History: Patient has never smoked and was not referred to quit line.    Patient Navigation: Patient education provided. Access to services provided for patient through [facility] program. No interpreter provided. No transportation provided   Colorectal Cancer Screening: Per patient has never had colonoscopy completed No complaints today. FIT test given.    Breast and Cervical Cancer Risk Assessment: Patient does not have family history of breast cancer, known genetic mutations, or radiation treatment to the chest before age 55. Patient does not have history of cervical dysplasia, immunocompromised, or DES exposure in-utero.  Risk Scores as of 07/03/2022     Dondra Spry           5-year 0.61 %   Lifetime 6.05 %   This patient is Hisp[place]/Latina but has no  documented birth country, so the [place] model used data from [place] patients to calculate their risk score. Document a birth country in the Demographics activity for a more accurate score.         Last calculated by Caprice Red, CMA on 07/03/2022 at 10:10 AM        A: BCCCP exam with pap smear No complaints with benign exam.   P: Referred patient to the [facility] of [facility] for a screening mammogram. Appointment scheduled 07/03/22.  Ilda Basset A, NP 07/03/2022 10:30 AM

## 2022-07-03 NOTE — Patient Instructions (Signed)
Taught MA Hedy Camara about self breast awareness and gave educational materials to take home. Patient did need a Pap smear today due to last Pap smear was in 06/25/21 per patient. Told patient about free cervical cancer screenings to receive a Pap smear if would like one next year. Let her know BCCCP will cover Pap smears every 5 years unless has a history of abnormal Pap smears. Referred patient to the [facility] for screening mammogram. Appointment scheduled for 07/03/22. Patient aware of appointment and will be there. Let patient know will follow up with her within the next couple weeks with results. MA Ivonne Selena Lesser verbalized understanding.  Pascal Lux, NP 10:32 AM

## 2022-07-06 LAB — CYTOLOGY - PAP
Adequacy: ABSENT
Comment: NEGATIVE
High risk HPV: NEGATIVE

## 2022-07-10 ENCOUNTER — Telehealth: Payer: Self-pay

## 2022-07-10 NOTE — Telephone Encounter (Signed)
Using [employer], [place], [id number], pt has been advised of her PAP results and follow-up recommendation to repeat her PAP in one year. Pt expressed understanding of this information.

## 2022-07-10 NOTE — Telephone Encounter (Signed)
-----   Message from Pascal Lux, NP sent at 07/07/2022  7:58 AM EDT ----- This patient needs one year follow up. ----- Message ----- From: Interface, [facility] Sent: 07/06/2022   7:29 PM EDT To: Pascal Lux, NP

## 2023-06-23 ENCOUNTER — Telehealth: Payer: Self-pay

## 2023-06-23 NOTE — Telephone Encounter (Signed)
 Telephoned patient at mobile number using interpreter, Orie Fisherman. Left a voice message with BCCCP contact information.

## 2023-06-23 NOTE — Telephone Encounter (Signed)
-----   Message from [facility] M sent at 06/08/2023 12:08 PM EDT ----- Regarding: BCCCP renewal This patient is due for BCCCP renewal. She will need a screening mammo and pap after 07-03-23.  Thanks,  [employer]

## 2023-06-25 ENCOUNTER — Other Ambulatory Visit: Payer: Self-pay | Admitting: Obstetrics and Gynecology

## 2023-06-25 DIAGNOSIS — Z1231 Encounter for screening mammogram for malignant neoplasm of breast: Secondary | ICD-10-CM

## 2023-10-06 ENCOUNTER — Ambulatory Visit
Admission: RE | Admit: 2023-10-06 | Discharge: 2023-10-06 | Disposition: A | Discharge status: Home / Self Care | Source: Ambulatory Visit | Attending: Obstetrics and Gynecology | Admitting: Obstetrics and Gynecology

## 2023-10-06 ENCOUNTER — Ambulatory Visit: Admitting: *Deleted

## 2023-10-06 VITALS — BP 150/100 | Wt 142.0 lb

## 2023-10-06 DIAGNOSIS — Z1231 Encounter for screening mammogram for malignant neoplasm of breast: Secondary | ICD-10-CM

## 2023-10-06 DIAGNOSIS — Z1211 Encounter for screening for malignant neoplasm of colon: Secondary | ICD-10-CM

## 2023-10-06 DIAGNOSIS — Z01419 Encounter for gynecological examination (general) (routine) without abnormal findings: Secondary | ICD-10-CM

## 2023-10-06 NOTE — Progress Notes (Signed)
 Rachel Le is a 53 y.o. G40P0050 female who presents to [facility] clinic today with no complaints.    Pap Smear: Pap smear completed today. Last Pap smear was 07/03/2022 at [facility] clinic and was abnormal - LSIL with negative HPV that a repeat Pap smear in one year is recommended for follow up. 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 Last Pap smear result is available in Epic.   Physical exam: Breasts Breasts symmetrical. No skin abnormalities bilateral breasts. No nipple retraction bilateral breasts. No nipple discharge bilateral breasts. No lymphadenopathy. No lumps palpated bilateral breasts. No complaints of pain or tenderness on exam.    MS 3D SCR MAMMO BILAT BR (aka MM) Result Date: 07/07/2022 CLINICAL DATA:  Screening. EXAM: DIGITAL SCREENING BILATERAL MAMMOGRAM WITH TOMOSYNTHESIS AND CAD TECHNIQUE: Bilateral screening digital craniocaudal and mediolateral oblique mammograms were obtained. Bilateral screening digital breast tomosynthesis was performed. The images were evaluated with computer-aided detection. COMPARISON:  Previous exam(s). ACR Breast Density Category b: There are scattered areas of fibroglandular density. FINDINGS: There are no findings suspicious for malignancy. IMPRESSION: No mammographic evidence of malignancy. A result letter of this screening mammogram will be mailed directly to the patient. RECOMMENDATION: Screening mammogram in one year. (Code:SM-B-01Y) BI-RADS CATEGORY  1: Negative. Electronically Signed   By: Dina  Arceo M.D.   On: 07/07/2022 16:00   MS DIGITAL SCREENING TOMO BILATERAL Result Date: 06/26/2021 CLINICAL DATA:  Screening. EXAM: DIGITAL SCREENING BILATERAL MAMMOGRAM WITH TOMOSYNTHESIS AND CAD TECHNIQUE: Bilateral screening digital craniocaudal and mediolateral oblique mammograms were obtained. Bilateral screening digital breast  tomosynthesis was performed. The images were evaluated with computer-aided detection. COMPARISON:  Previous exam(s). ACR Breast Density Category c: The breast tissue is heterogeneously dense, which may obscure small masses. FINDINGS: There are no findings suspicious for malignancy. IMPRESSION: No mammographic evidence of malignancy. A result letter of this screening mammogram will be mailed directly to the patient. RECOMMENDATION: Screening mammogram in one year. (Code:SM-B-01Y) BI-RADS CATEGORY  1: Negative. Electronically Signed   By: Elspeth Bathe M.D.   On: 06/26/2021 14:49   MS DIGITAL SCREENING TOMO BILATERAL Result Date: 05/09/2020 CLINICAL DATA:  Screening. EXAM: DIGITAL SCREENING BILATERAL MAMMOGRAM WITH TOMOSYNTHESIS AND CAD TECHNIQUE: Bilateral screening digital craniocaudal and mediolateral oblique mammograms were obtained. Bilateral screening digital breast tomosynthesis was performed. The images were evaluated with computer-aided detection. COMPARISON:  Previous exam(s). ACR Breast Density Category b: There are scattered areas of fibroglandular density. FINDINGS: There are no findings suspicious for malignancy. The images were evaluated with computer-aided detection. IMPRESSION: No mammographic evidence of malignancy. A result letter of this screening mammogram will be mailed directly to the patient. RECOMMENDATION: Screening mammogram in one year. (Code:SM-B-01Y) BI-RADS CATEGORY  1: Negative. Electronically Signed   By: Alm Parkins M.D.   On: 05/09/2020 08:18   MS DIGITAL SCREENING TOMO BILATERAL Result Date: 05/06/2019 CLINICAL DATA:  Screening. EXAM: DIGITAL SCREENING BILATERAL MAMMOGRAM WITH TOMO AND CAD COMPARISON:  Previous exam(s). ACR Breast Density Category c: The breast tissue is heterogeneously dense, which may obscure small masses. FINDINGS: There are no findings suspicious for malignancy. Images were processed with CAD. IMPRESSION: No mammographic evidence of malignancy. A result  letter of this screening mammogram will be mailed directly to the patient. RECOMMENDATION: Screening mammogram in one year. (Code:SM-B-01Y) BI-RADS CATEGORY  1: Negative. Electronically Signed   By: Devere Shlomo HERO.D.  On: 05/06/2019 09:47   Pelvic/Bimanual Ext Genitalia No lesions, no swelling and no discharge observed on external genitalia.        Vagina Vagina pink and normal texture. No lesions or discharge observed in vagina.        Cervix Cervix is present. Cervix pink and of normal texture. No discharge observed. Cervix friable.   Uterus Uterus is present and palpable. Uterus in normal position and normal size.        Adnexae Bilateral ovaries present and palpable. No tenderness on palpation.         Rectovaginal No rectal exam completed today since patient had no rectal complaints. No skin abnormalities observed on exam.     Smoking History: Patient has never smoked.   Patient Navigation: Patient education provided. Access to services provided for patient through BCCCP program.   Colorectal Cancer Screening: Per patient has never had colonoscopy completed. FIT test given to patient to complete. No complaints today.    Breast and Cervical Cancer Risk Assessment: Patient does not have family history of breast cancer, known genetic mutations, or radiation treatment to the chest before age 75. Patient has history of cervical dysplasia. Patient has no history of being immunocompromised or DES exposure in-utero.  Risk Scores as of Encounter on 10/06/2023     Rachel Le           5-year 0.63%   Lifetime 5.96%   This patient is [place]/Latina but has no documented birth country, so the [place] model used data from [place] patients to calculate their risk score. Document a birth country in the Demographics activity for a more accurate score.         Last calculated by Logan Lyle BRAVO, CMA on 10/06/2023 at  9:27 AM        A: BCCCP exam with pap smear No  complaints.  P: Referred patient to the [facility] for a screening mammogram on mobile unit. Appointment scheduled Tuesday, October 06, 2023 at 1030.  Driscilla Wanda SQUIBB, RN 10/06/2023 9:48 AM

## 2023-10-06 NOTE — Patient Instructions (Signed)
 Explained breast self awareness with MA Art Larae Mad. Pap smear completed today. Let patient know if today's Pap smear is normal and HPV negative that her next Pap smear is due in one year due to her history of an abnormal Pap smear. Referred patient to the [facility] of [facility] for a screening mammogram on mobile unit. Appointment scheduled Tuesday, October 06, 2023 at 1030. Patient aware of appointment and will be there. Let patient know will follow up with her within the next couple weeks with results of Pap smear by letter or phone. Informed patient that the Breast Center will follow up with her within the next couple of weeks with results of her mammogram by letter or phone. MA Ivonne Larae Mad verbalized understanding.  Abas Leicht, Wanda Ship, RN 9:48 AM

## 2023-10-08 ENCOUNTER — Ambulatory Visit: Payer: Self-pay | Admitting: *Deleted

## 2023-10-08 LAB — CYTOLOGY - PAP
Comment: NEGATIVE
Comment: NEGATIVE
Comment: NEGATIVE
Diagnosis: NEGATIVE
HPV 16: NEGATIVE
HPV 18 / 45: NEGATIVE
High risk HPV: POSITIVE — AB

## 2023-10-08 NOTE — Progress Notes (Signed)
 Hello,  Please refer for colposcopy per ASCCP guidelines.  Thanks, [employer]

## 2023-10-12 ENCOUNTER — Telehealth: Payer: Self-pay

## 2023-10-12 NOTE — Telephone Encounter (Signed)
Left message for patient about lab results. Left name and number for patient to call back. 

## 2023-10-14 NOTE — Telephone Encounter (Signed)
 Spoke with patient about pap results. Informed patient that her pap was normal but was positive for HPV. Explained to the patient that based on her previous hx the recommendation is for a repeat colpo. Patient voiced understanding. Referral sent to [facility] to get her scheduled for colpo.

## 2023-11-19 ENCOUNTER — Ambulatory Visit: Admitting: Obstetrics & Gynecology

## 2023-12-01 ENCOUNTER — Ambulatory Visit (INDEPENDENT_AMBULATORY_CARE_PROVIDER_SITE_OTHER): Payer: Self-pay | Admitting: Obstetrics and Gynecology

## 2023-12-01 ENCOUNTER — Other Ambulatory Visit (HOSPITAL_COMMUNITY)
Admission: RE | Admit: 2023-12-01 | Discharge: 2023-12-01 | Disposition: A | Discharge status: Home / Self Care | Payer: Self-pay | Source: Ambulatory Visit | Attending: Obstetrics and Gynecology | Admitting: Obstetrics and Gynecology

## 2023-12-01 ENCOUNTER — Other Ambulatory Visit: Payer: Self-pay

## 2023-12-01 ENCOUNTER — Encounter: Payer: Self-pay | Admitting: Obstetrics and Gynecology

## 2023-12-01 VITALS — BP 150/85 | HR 66 | Wt 141.8 lb

## 2023-12-01 DIAGNOSIS — Z3202 Encounter for pregnancy test, result negative: Secondary | ICD-10-CM

## 2023-12-01 DIAGNOSIS — R87810 Cervical high risk human papillomavirus (HPV) DNA test positive: Secondary | ICD-10-CM | POA: Insufficient documentation

## 2023-12-01 LAB — POCT PREGNANCY, URINE: Preg Test, Ur: NEGATIVE

## 2023-12-01 NOTE — Progress Notes (Signed)
 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 [id number] here for colposcopy.  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    The risks (including infection, bleeding, pain) and benefits of the procedure were explained to the patient and written informed consent was obtained.  The patient was placed in the dorsal lithotomy position. A Graves was speculum inserted in the vagina, and the cervix was visualized.  The cervix was stained with acetic acid and visualized using the colposcope under magnification as well as with a green filter. Findings as below. Cervical biopsies were taken at 3 and 10 o'clocl. Endocervical curettage then performed in all four quadrants. Small amount of bleeding noted that improved with pressure. Monsel's solution applied with good hemostasis noted. Patient tolerating procedure well.  Findings: acetowhite at SCJ  Impression: low grade  Adequate: yes  Specimens:  Cervical biopsy at 3 and 10 o'clock Endocervical curettage  Condition: Stable  Complications: None  Plan: The patient was advised to call for any fever or for prolonged or severe pain or bleeding. She was advised to use OTC analgesics as needed for mild to moderate pain. She was advised to avoid vaginal intercourse for 48 hours or until the bleeding has completely stopped.  Will base further management on results of biopsy.   Rachel Yolanda Moats, MD, [facility] Attending Center for [facility])

## 2023-12-03 LAB — SURGICAL PATHOLOGY

## 2023-12-07 ENCOUNTER — Ambulatory Visit: Payer: Self-pay | Admitting: Obstetrics and Gynecology
